# Patient Record
Sex: Female | Born: 1965 | Race: Black or African American | Hispanic: No | Smoking: Never smoker
Health system: Southern US, Community
[De-identification: ages and names within clinical notes are randomized; demographics above are authoritative.]

## PROBLEM LIST (undated history)

## (undated) DIAGNOSIS — I10 Essential (primary) hypertension: Secondary | ICD-10-CM

## (undated) DIAGNOSIS — I639 Cerebral infarction, unspecified: Secondary | ICD-10-CM

## (undated) DIAGNOSIS — G4730 Sleep apnea, unspecified: Secondary | ICD-10-CM

## (undated) DIAGNOSIS — R531 Weakness: Secondary | ICD-10-CM

## (undated) DIAGNOSIS — G4700 Insomnia, unspecified: Secondary | ICD-10-CM

## (undated) DIAGNOSIS — K219 Gastro-esophageal reflux disease without esophagitis: Secondary | ICD-10-CM

## (undated) DIAGNOSIS — E785 Hyperlipidemia, unspecified: Secondary | ICD-10-CM

## (undated) DIAGNOSIS — F419 Anxiety disorder, unspecified: Secondary | ICD-10-CM

## (undated) HISTORY — DX: Hyperlipidemia, unspecified: E78.5

## (undated) HISTORY — DX: Anxiety disorder, unspecified: F41.9

## (undated) HISTORY — DX: Cerebral infarction, unspecified: I63.9

## (undated) HISTORY — PX: ABDOMINAL HYSTERECTOMY: SHX81

---

## 2001-04-20 DIAGNOSIS — I639 Cerebral infarction, unspecified: Secondary | ICD-10-CM

## 2001-04-20 HISTORY — DX: Cerebral infarction, unspecified: I63.9

## 2001-04-27 ENCOUNTER — Inpatient Hospital Stay (HOSPITAL_COMMUNITY)
Admission: RE | Admit: 2001-04-27 | Discharge: 2001-05-06 | Payer: Self-pay | Admitting: Physical Medicine & Rehabilitation

## 2003-01-27 ENCOUNTER — Encounter
Admission: RE | Admit: 2003-01-27 | Discharge: 2003-04-27 | Payer: Self-pay | Admitting: Physical Medicine & Rehabilitation

## 2003-04-28 ENCOUNTER — Encounter
Admission: RE | Admit: 2003-04-28 | Discharge: 2003-07-27 | Payer: Self-pay | Admitting: Physical Medicine & Rehabilitation

## 2003-12-26 ENCOUNTER — Encounter
Admission: RE | Admit: 2003-12-26 | Discharge: 2004-03-25 | Payer: Self-pay | Admitting: Physical Medicine & Rehabilitation

## 2004-02-01 ENCOUNTER — Encounter
Admission: RE | Admit: 2004-02-01 | Discharge: 2004-02-01 | Payer: Self-pay | Admitting: Physical Medicine & Rehabilitation

## 2004-04-04 ENCOUNTER — Encounter
Admission: RE | Admit: 2004-04-04 | Discharge: 2004-07-03 | Payer: Self-pay | Admitting: Physical Medicine & Rehabilitation

## 2004-09-30 ENCOUNTER — Encounter
Admission: RE | Admit: 2004-09-30 | Discharge: 2004-12-29 | Payer: Self-pay | Admitting: Physical Medicine & Rehabilitation

## 2004-10-15 ENCOUNTER — Ambulatory Visit: Payer: Self-pay | Admitting: Physical Medicine & Rehabilitation

## 2005-01-08 ENCOUNTER — Encounter
Admission: RE | Admit: 2005-01-08 | Discharge: 2005-04-08 | Payer: Self-pay | Admitting: Physical Medicine & Rehabilitation

## 2005-01-10 ENCOUNTER — Ambulatory Visit: Payer: Self-pay | Admitting: Physical Medicine & Rehabilitation

## 2005-07-01 ENCOUNTER — Encounter
Admission: RE | Admit: 2005-07-01 | Discharge: 2005-09-29 | Payer: Self-pay | Admitting: Physical Medicine & Rehabilitation

## 2005-07-01 ENCOUNTER — Ambulatory Visit: Payer: Self-pay | Admitting: Physical Medicine & Rehabilitation

## 2005-08-11 ENCOUNTER — Ambulatory Visit: Payer: Self-pay | Admitting: Physical Medicine & Rehabilitation

## 2005-10-31 ENCOUNTER — Ambulatory Visit: Payer: Self-pay | Admitting: Physical Medicine & Rehabilitation

## 2005-10-31 ENCOUNTER — Encounter
Admission: RE | Admit: 2005-10-31 | Discharge: 2006-01-29 | Payer: Self-pay | Admitting: Physical Medicine & Rehabilitation

## 2005-12-23 ENCOUNTER — Ambulatory Visit: Payer: Self-pay | Admitting: Physical Medicine & Rehabilitation

## 2008-09-15 ENCOUNTER — Encounter: Payer: Self-pay | Admitting: Gastroenterology

## 2009-10-19 ENCOUNTER — Encounter: Admission: RE | Admit: 2009-10-19 | Discharge: 2009-10-19 | Payer: Self-pay | Admitting: Family Medicine

## 2009-10-30 ENCOUNTER — Encounter: Admission: RE | Admit: 2009-10-30 | Discharge: 2009-10-30 | Payer: Self-pay | Admitting: Family Medicine

## 2009-11-14 ENCOUNTER — Encounter: Admission: RE | Admit: 2009-11-14 | Discharge: 2009-11-14 | Payer: Self-pay | Admitting: Family Medicine

## 2010-02-19 ENCOUNTER — Encounter
Admission: RE | Admit: 2010-02-19 | Discharge: 2010-05-15 | Payer: Self-pay | Admitting: Physical Medicine & Rehabilitation

## 2010-02-25 ENCOUNTER — Ambulatory Visit: Payer: Self-pay | Admitting: Physical Medicine & Rehabilitation

## 2010-03-28 ENCOUNTER — Encounter
Admission: RE | Admit: 2010-03-28 | Discharge: 2010-04-04 | Payer: Self-pay | Admitting: Physical Medicine & Rehabilitation

## 2010-04-04 ENCOUNTER — Ambulatory Visit: Payer: Self-pay | Admitting: Physical Medicine & Rehabilitation

## 2010-05-15 ENCOUNTER — Ambulatory Visit: Payer: Self-pay | Admitting: Physical Medicine & Rehabilitation

## 2010-10-08 NOTE — Letter (Signed)
Summary: Work [facility]  [address] [place], [place] [address]   Phone: [phone]  Fax: [phone]    Today's Date: September 15, 2008  Name of Patient: Penny Murphy  The above named patient had a medical visit today at:  am / pm.  Please take this into consideration when reviewing the time away from work/school.    Special Instructions:  [  ] None  [  ] To be off the remainder of today, returning to the normal work / school schedule tomorrow.  [  ] To be off until the next scheduled appointment on  [  ] Other Please excuse patient for 09/14/2008.  Will be able to return to work 09/15/2008. __Due to procedure and sedation.  Patient will be able to return to full activity 09/15/2008.   Sincerely yours,   Milford Cage CMA

## 2011-01-24 NOTE — Assessment & Plan Note (Signed)
Penny Murphy is back regarding her left-sided stroke with right hemiparesis.  Penny Murphy continues to do fairly well with her phenol injections in the right  wrist. She still has some tone and is able to work on ranging the hand  manually. She wears a splint at night. She had a total abdominal  hysterectomy and oophorectomy on December 02, 2005 and seems to be recovering  fairly well from that. She is driving locally. Has no new complaints other  than feeling a little bit tired and a bit sore from her recent surgery. The  patient rates her sleep as fair to poor at times. She uses Ambien to help  her rest. No other new history items are noted today.   REVIEW OF SYSTEMS:  On review of systems patient denies any neurological,  psychiatric, constitutional, GI or cardiorespiratory complaints today. She  does have some night sweats. The full review is in the health and history  section.   SOCIAL HISTORY:  The patient is single and lives alone currently.   PHYSICAL EXAMINATION:  Blood pressure is 120/78, pulse is 90, respiratory  rate 16, she is saturating 99% on room air.   The patient is pleasant, in no acute distress. She is alert and oriented x3.  Affect is bright and appropriate. Gait is slightly wide-based but stable.  Coordination is fair. She still has some weakness in the right upper  extremity. She is 1+ to 2+ out of 5. Tone is 1+ at the wrist and fingers.  Sensory exam is fair in the right upper extremity today. Speech is good and  patient is articulate and speaks fluently. Cognitively she is appropriate.  Right shoulder is nontender bicipital maneuvers and impingement maneuvers  today.  Abdomen - wound is healing nicely. Patient remains overweight.   ASSESSMENT:  1.  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  2.  Insomnia.  3.  Bicipital tendinitis improved after injection.   PLAN:  1.  Continue baclofen and range of motion for tone.  2.  Consider  constraint  induced therapies although transportation is an      issue.  3.  I encouraged improved dietary habits and increased exercise.  4.  I will follow up with the patient in approximately 4 month's time.      Ranelle Oyster, M.D.  Electronically Signed     ZTS/MedQ  D:  12/24/2005 09:43:59  T:  12/24/2005 21:40:30  Job #:  [id number]

## 2011-01-24 NOTE — Discharge Summary (Signed)
Penny Murphy. [facility]  Patient:    Penny Murphy, Penny Murphy Visit Number: [id number] MRN: [id number]          Service Type: [facility] Location: [id number] Attending Physician:  Faith Rogue T Dictated by:   Dian Situ, PA Admit Date:  04/27/2001 Discharge Date: 05/06/2001   CC:         Dr. Sim Boast  Dr. [facility] Dr. Sudie Bailey   Discharge Summary  DISCHARGE DIAGNOSES: 1. Status post cerebrovascular accident. 2. History of drug abuse. 3. Bacterial vaginosis. 4. Syphilis positive titers. 5. Situational depression.  HISTORY OF PRESENT ILLNESS:  Penny Murphy is a 45 year old female who collapsed at home and was taken to [facility] ED by an acquaintance on August 13.  She was unresponsive at the time of admission.  PPD done was negative.  MRI showed posterior ischemic changes in left MCA/ACA region.  Carotid duplex was negative for ICA stenosis.  Cardiac echocardiogram showed LVH with EF of 65% with mild to moderate TR, no mass or thrombus.  Drug urine was positive for cocaine.  She was noted to have right hemiparesis and aphasia with right neglect. Neurology was consulted and coagulopathy studies were ordered on August 19.  Currently, reports are unavailable.  She was placed on aspirin for CVA prophylaxis.  She was noted to have vaginal discharge.  GYN exam was positive for bacterial vaginosis and the patient was started on Flagyl.  She is currently standby assist for transfers, minimal assist to standby assist for ambulating greater than 100 feet with assist.  She was noticed to have a depressed mood and was started on Zoloft for this.  PAST MEDICAL HISTORY: 1. Positive for two child births. 2. Alcohol abuse.  ALLERGIES:  No known drug allergies.  SOCIAL HISTORY:  The patient was living with a friend and was independent prior to admission.  Sister and children to assist past discharge.  She denies tobacco and  alcohol use.  She reports cocaine use two to three times a week.  HOSPITAL COURSE:  [name] was admitted to rehabilitation on April 27, 2001, for inpatient therapies to consist of PT and OT daily.  Past admission, the patient was maintained on aspirin for CVA prophylaxis.  She completed a seven-day course of Flagyl for her bacterial vaginosis.  Labs done on admission showed her urine to have 10,000 colonies of Klebsiella pneumoniae and she was treated with a previous course of Cipro for this.  Other labs showed hemoglobin 12.7, hematocrit 37.3, white count 7.8 and platelets 265. Sodium was 138, potassium 4.2, chloride 102, CO2 32, BUN 18, creatinine 0.8, glucose 91.  The patient was noted to be nonverbal at the time of admission, however, she had selective outbursts to certain people.  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  She shows mild to moderate deficits in reading comprehension.  There is mild to moderate deficits in written language with bisyllabic and monosyllabic levels.  Currently, she is 100% reliable for yes/no comprehension.  She is able to follow two-step commands, however, difficulty with three-step commands.  She is able to understand basic conversation.  Expression is improving, however, she requests minimal assistance with repetition of words.  She does show increased initiation to express basic needs, however, she is not consistent and does not initiate conversation.  She does respond to communication.  She was noted to have some problems with bladder incontinence and has been encouraged to self-toilet frequently.  Her  problems with incontinence have resolved by the time of discharge as the patient has been self-toileting more consistently. She is currently at distant supervision for ADL needs.  She requires minimal verbal cues to follow sequencing to set up and complete her task.  She does show good balance with functioning mobility and  control environment.  She is independent of transfers.  She is independent for ambulating 100 feet on level services and controlled environment supervision on uneven terrain.  She requires supervision for navigating stairs.  She requires supervision for safety secondary to a right inner tension in unsupervised or community settings or on uneven terrain.  Further followup therapies to include outpatient PT/OT and speech therapy to continue past discharge.  Of note, she was started on a Ritalin trial to help with her initiation output as well as mood.  This has greatly improved her participation and she is discharged on 5 mg b.i.d. to continue.  DISCHARGE MEDICATIONS: 1. Coated aspirin 225 mg per day. 2. Zoloft 50 mg per day. 3. Ritalin 5 mg b.i.d. at 8 a.m. and noon.  ACTIVITY:  She needs 24-hour supervision.  DIET:  Regular.  SPECIAL INSTRUCTIONS:  Absolutely no alcohol, no drugs, no driving.  Do have partner or partners tested and treated for syphilis.  FOLLOWUP:  The patient is to follow up with Dr. Riley Kill on October 2.  Follow up with a local physician at [facility]. Dictated by:   Dian Situ, PA Attending Physician:  Faith Rogue T DD:  05/06/01 TD:  05/06/01 Job: [phone] [id number]

## 2011-01-24 NOTE — Assessment & Plan Note (Signed)
Penny Murphy is back regarding her left CVA with spastic right hemiparesis.  She  has done well with the phenol injections to the right median nerve.  She has  some slight tightness still at the hand.  She uses a resting right wrist  hand orthosis at night.  She is interested in taking some classes in child  development at the [facility].  She is driving now after  receiving her license last month.  She has had no problems with her driving  abilities.  Her arm and biceps area has been bothering her as of late.  She  is not sure what brought this on.  She has had some boils examined in the  right axillary area but does not feel that these are directly relating to  the arm pain.  Sleep is poor at times.  She still has some spasms on the  right side.   REVIEW OF SYSTEMS:  Patient reports some tingling, spasms in the right side.  She has some intermittent coughing.  Otherwise pertinent positive listed  above and full review is in the health and history section of the chart.   SOCIAL HISTORY:  Patient is single.  Other pertinent issues are listed  above.   PHYSICAL EXAMINATION:  GENERAL APPEARANCE:  Patient is pleasant, in no acute  distress.  She is alert and oriented x3.  Affect is bright and appropriate.  VITAL SIGNS:  Blood pressure 132/68, pulse 62, respiratory rate 16, she is  sating 97% on room air.  LUNGS:  Clear.  CARDIOVASCULAR:  Regular rate and rhythm.  ABDOMEN:  Soft and nontender.  NEUROLOGIC:  Gate is slightly shuffling, favor the right side but stable  today.  Coordination is improved in the right upper extremity.  She has 2+/5  strength in the right upper extremity.  Tone remains trace to elbow and  wrists.  She still has 1+ to 2/4 tone at the deep finger flexor muscles.  Speech is stable.  Cognitively she is very appropriate.  Patient had  tenderness along the long head biceps tendon today with pain into the biceps  muscle.  Impingement testing  was  equivocal.   ASSESSMENT:  1.  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  2.  Insomnia.  3.  Right upper arm pain consistent with bicipital tendinitis.   PLAN:  1.  After informed consent, we injected the right long head biceps tendon      with 40 mg of Kenalog and 3 mL of 1% lidocaine.  Patient tolerated this      well.  I advised her to place ice over the area.  She will use her      hydrocodone that she received from the ER for a few days as needed for      pain.  I suspect that this will improve after this injection.  I will      call in refills for her pain medications if needed only.  2.  Continue Baclofen for tone.  3.  Encourage activity and safe driving.  4.  I will see the patient back in about two months' time.  Will consider      follow-up phenol injections at that point.      Ranelle Oyster, M.D.  Electronically Signed     ZTS/MedQ  D:  11/03/2005 10:04:49  T:  11/03/2005 15:28:49  Job #:  [id number]

## 2011-01-24 NOTE — Assessment & Plan Note (Signed)
MEDICAL RECORD NUMBER:  [id number].   Penny Murphy is back regarding her CVA and spastic right hemiparesis. She has done  well in the past with the Botox. She has had some increase in her right  handed spasticity since I last saw her in October. She is trying to stretch  her hand but is having difficulty secondary to the increased tone. She does  not have a hand/wrist splint. She also reports problems with sleeping. She  denies problems with her mood. She is not having a lot of pain overall. She  is living at home independently at this point.   REVIEW OF SYSTEMS:  The patient denies chest pain, palpitations. She has had  occasional shortness of breath. She has had no wheezing or coughing. Denies  seizures, weakness, numbness, confusion, blurred vision. She is getting four  hours of sleep a night. She has had no agitation, nausea, vomiting,  diarrhea, or constipation. She had no recent fevers. She has had some weight  gain. No skin changes or sweating noted.   PHYSICAL EXAMINATION:  On physical exam today, blood pressure 120/80, pulse  76. She is saturating 99% on room air. Respiratory rate is 14. The patient  walks with a slight limp and is alert and appropriate. She still had some  mild word finding deficits. Her speech is still not polished with articles  missing frequently in her grammar. Strength in the upper extremity on the  right is 3+ to 4/5 proximally and 1+/5 distally. Tone is 2+/4 at the deep  finger flexors. Wrist tone is stable as is biceps tone. Left upper extremity  strength is 4+ to 5/5. Lower extremities are intact with 5/5 strength  essentially.   ASSESSMENT:  1. Status post cerebral vascular accident, 434.01.  2. 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  3. Insomnia 780.52.   PLAN:  1. Will begin her on 50 mg of trazodone at bedtime. The patient may repeat     this x1.  2. I will set the patient up with Botox injections 200 units to the right     deep finger flexors.  3.  Will consider therapy trial and splinting after the injections are     completed.  4. I will see the patient back pending scheduling.      Ranelle Oyster, M.D.   ZTS/MedQ  D:  12/27/2003 11:18:41  T:  12/27/2003 12:40:59  Job #:  [id number]

## 2011-01-24 NOTE — Assessment & Plan Note (Signed)
Penny Murphy is back regarding her right hemiparesis.  We performed a fenyl  injection in February with decrease of her tone to a 0/4.  The patient has  had some interval increase in her tone again, but still feels the hand is  doing better.  She denies pain.  She remains active in the home with self-  care.  She would like to work again.  She does wear her splint regularly and  stretch the hand to exercise.  She plans on getting her drivers license.  She has not spoken with vocational rehab as of late.   SOCIAL HISTORY:  No new issues other than as mentioned above.   REVIEW OF SYSTEMS:  Stable.  Full review of systems is in the health and  history section of the chart.   PHYSICAL EXAMINATION:  VITAL SIGNS:  Blood pressure is 129/78, pulse 75,  respiratory rate 18, she is saturating 97% on room air.  GENERAL:  The patient is pleasant in no acute distress.  NEUROLOGIC:  Strength is 3+-4/5 proximally.  At the hand she is 2 to 2-/5.  Tone is 1-1+/2 at the wrist and fingers.  She had better active extension  today of the fingers than I have seen in the past.  Remains with decreased  sensation on the right.  Speech is good.  She has very good and fluent  speech with few word-finding deficits.  HEART:  Regular rate and rhythm.  LUNGS:  Clear.  ABDOMEN:  Soft and nontender.  The patient is moderately obese.   ASSESSMENT:  1.  Status post cerebrovascular accident.  2.  Spastic right hemiparesis.  3.  Insomnia.   PLAN:  1.  Encouraged home exercise program and stretching of the right upper      extremity.  2.  Urged the patient to contact vocational rehab as she has not heard from      them in a few months.  3.  Baclofen 20 mg q.i.d.  4.  Consider fenyl injection later on this year depending on evolution of      her tone.  5.  Continue with Zoloft and aspirin as ordered.  6.  I will see the patient back in six months' time.      ZTS/MedQ  D:  01/10/2005 09:57:13  T:  01/10/2005 10:31:12   Job #:  [id number]

## 2011-01-24 NOTE — Assessment & Plan Note (Signed)
DATE OF VISIT:  October 02, 2004   MEDICAL RECORD NUMBER:  [id number]   HISTORY:  Penny Murphy is back regarding her CVA with right spastic hemiparesis.  She has done fairly well with Botox injections but still has tightness in  the right wrist and fingers.  She still does not have functional use of the  hand, however.  She is able to range them freely and able to use splinting.  She does not use her splint consistently.  Patient denies frank hand or arm  pain.  She is independent at the household level.  She has not driven yet.  She spoke to vocational rehab one time since our last visit and they are  scheduled to be meeting with her again in the next month or two.  She still  wants to go back to work.   SOCIAL HISTORY:  Patient has been a bit stressed since her sister recently  was admitted to [facility] with a heart attack.   REVIEW OF SYSTEMS:  Patient denies any chest pain, shortness of breath,  cold, flu, wheezing, or coughing symptoms.  Sleep has been a little more  difficult of late with her sister's problems.  She denies any weakness,  numbness, spasms, problems with confusion, headaches, vomiting, diarrhea,  constipation, bowel or bladder incontinence.   PHYSICAL EXAMINATION:  Blood pressure is __________/84, pulse is 80,  respiratory rate is 18, she is saturating 97% on room air.  Patient walks  with a fairly normal gait but still favors the right side a bit.  Affect is  bright and appropriate.  Appearance is well kept.  Right upper extremity  strength remains 3+ to 4/5 proximally and 1+ to trace distally.  She has 2/4  tone at the right wrist and the fingers once again today.  She has trace  tone at the shoulder and biceps.  Right lower extremity strength is around  3+ to 4/5 throughout.  There is some decreased pinprick on the right side  but the patient has fair sensory function.  Speech is good.  She continues  to form thoughts better although there still are some mild  wordfinding  deficits.  Heart is regular rate and rhythm.  Lungs are clear.  Abdomen is  nontender, nontender.  Extremities showed no clubbing, cyanosis, or edema.   ASSESSMENT:  1.  Status post cerebrovascular accident (54098).  2.  Spastic right hemiparesis (34211).  3.  Insomnia (11914).   PLAN:  1.  We will set the patient up for phenol injections in the right median      nerve at the elbow.  We discussed the pros and cons of this injection      and the patient wishes to proceed.  2.  Await word from vocational rehab.  3.  Continue baclofen 20 mg q.i.d.  4.  I will see the patient back pending scheduling of said injection.      ZTS/MedQ  D:  10/02/2004 10:25:30  T:  10/02/2004 11:11:55  Job #:  [id number]

## 2011-01-24 NOTE — Procedures (Signed)
Penny Murphy, Penny Murphy                 ACCOUNT NO.:  [id number]   MEDICAL RECORD NO.:  [id number]          PATIENT TYPE:  REC   LOCATION:  [facility]                          FACILITY:  [facility]   PHYSICIAN:  Ranelle Oyster, M.D.DATE OF BIRTH:  [birth year]   DATE OF PROCEDURE:  08/12/2005  DATE OF DISCHARGE:                                 OPERATIVE REPORT   PROCEDURE:  Phenol injection, diagnostic code 342.11.   DESCRIPTION OF PROCEDURE:  After informed consent and preparation with  Betadine, we localized the right median nerve at the elbow.  We initially  probed with 80 mA amplitude and titrated down to 0.5 mA while at the same  time eliciting significant muscle contraction of the median-associated  nerves.  After aspiration we injected 5 cc of aqueous phenol around the  median nerve with a 50 mm 26-gauge monopolar needle.  The patient tolerated  well and had immediate relaxation of her median nerve muscles.  The patient  will continue bracing and range of motion.  She will be resuming driving  this month apparently as well.   I will see the patient back in about three months' time.  I refilled  Baclofen today for her, #90.      Ranelle Oyster, M.D.  Electronically Signed     ZTS/MEDQ  D:  08/12/2005 14:21:09  T:  08/13/2005 06:39:48  Job:  [id number]

## 2011-01-24 NOTE — Procedures (Signed)
NAMEBARRETT, HOLTHAUS                 ACCOUNT NO.:  [id number]   MEDICAL RECORD NO.:  [id number]          PATIENT TYPE:  REC   LOCATION:  [facility]                          FACILITY:  [facility]   PHYSICIAN:  Ranelle Oyster, M.D.DATE OF BIRTH:  1966/07/16   DATE OF PROCEDURE:  10/16/2004  DATE OF DISCHARGE:                                 OPERATIVE REPORT   DATE OF VISIT:  October 16, 2004.   MEDICAL RECORD NUMBER:  [id number].   DATE OF BIRTH:  1966-02-25.   DIAGNOSIS:  Spastic right hemiparesis status post cerebrovascular accident.  ICD9 codes 434.01 and 342.11.   DESCRIPTION OF PROCEDURE:  After informed consent and sterile preparation  with Betadine, we localized the median nerve at the elbow via surface and  then needle stimulation.  We used a 25 mm 26 gauge injectable monopolar  needle.  Initial high-amplitude stimulation was used and then eventually we  localized the median nerve via stimulation at 0.5 MA.  The area then was  aspirated and injected with 5 mL of aqueous phenol solution.  The patient  tolerated the procedure fairly well.  No bleeding or complications were  noted.  Post injection Ashworth scores at the wrist were 0/4.  At the  fingers, she was 1+/4.  The patient was advised to place ice over the elbow  crease.  Will follow up with her in approximately three months' time.      ZTS/MEDQ  D:  10/16/2004 09:45:53  T:  10/16/2004 10:34:29  Job:  [id number]

## 2011-01-24 NOTE — Assessment & Plan Note (Signed)
Penny Murphy is back regarding her spastic right hemiparesis.  She had great  result with Phenol injection.  She has had some emerging tone once again in  the finger flexors.  Hand still is improved from her baseline.  She has  plans of getting her license and going back to work.  Her mood has been  excellent.  She is attempting to work with vocational rehab regarding job  issues.   The patient denies any pain.  Spasticity interferes with general activity,  relationship with others, and enjoyment of life on a mild level.  Sleep is  fair to poor.   CURRENT MEDICATIONS:  1.  Zoloft 50 mg a day.  2.  Baclofen 20 mg q.i.d.   REVIEW OF SYSTEMS:  The patient denies any neurologic, psychiatric,  constitutional, GU, GI complaints today.  The patient has some difficulty  with sleep apnea.   SOCIAL HISTORY:  Pertinent positives listed above.  The patient lives alone.   PHYSICAL EXAMINATION:  VITAL SIGNS:  Blood pressure 123/75, pulse 82,  respiratory rate 18.  She is saturating 98% on room air.  GENERAL:  The patient is pleasant and in no acute distress.  The patient is  alert and oriented x3.  Affect is bright and pleasant.  HEART:  Regular rate and rhythm.  LUNGS:  Clear.  ABDOMEN:  Soft and nontender.  NEUROLOGY:  Coordination is fair on the left side.  Right upper extremity is  limited in movement.  Strength is still 3+ to 4 out of 5 proximally and 2  out of 5 distally.  Masked somewhat by her tone.  Tone is trace to 1+ at the  wrist and 2/2+ at the finger flexors.  Speech is improved.  She is very  fluent and articulate today.  Remainder of her neurologic examination is  stable.   ASSESSMENT:  1.  Status post cerebrovascular accident.  2.  Spastic right hemiparesis.  3.  Insomnia.   PLAN:  1.  We will set the patient up for Phenol injection of the right median      nerve at the elbow.  2.  Continue Baclofen 20 mg q.i.d. which I refilled today.  3.  Taper Zoloft to off over 3 weeks  time.  4.  I will see her back pending injections.      Ranelle Oyster, M.D.  Electronically Signed     ZTS/MedQ  D:  07/02/2005 09:44:41  T:  07/02/2005 11:28:41  Job #:  [id number]

## 2011-01-24 NOTE — Assessment & Plan Note (Signed)
MEDICAL RECORD NUMBER:  [id number].   Penny Murphy is back regarding her CVA with right spastic hemiparesis. She has  done pretty well with the Botox injections which we did into the right  forearm and finger flexors. She has still has some tone there but is looser  and more easily able to manipulate. She is not using her brace consistently  at home although she is waiting for a new one through independent living.  The patient really denies significant pain. She has 2/10 pain on average.  She had improved ambulation in upper extremity use. She is independent with  all of her mobility and self-care. She has lost some weight, stating that  she lost about six inches on her waist. The patient's mood has been good.  She is interested in going back to work.   REVIEW OF SYSTEMS:  The patient denies any chest pain, palpitations. She  does have occasional shortness of breath but denies wheezing, coughing,  seizures, weakness, numbness, spasms. Denies any problems with vision,  anxiety, sleep, or headaches. Denies nausea, vomiting, reflux, diarrhea,  constipation, or abdominal pain. The patient denies fevers, chills, weight  loss, changes in her skin.   PHYSICAL EXAMINATION:  On physical examination today the patient is pleasant  in no acute distress. Blood pressure is 115/78, pulse 90, respiratory rate  14. She is saturating 99% on room air. The patient walks with a slight limp  to the right side but is efficient. She is alert and appropriate. Appearance  is normal. Right upper extremity strength is 3+ to 4/5 proximally at the  hand and wrist. She is 1+ to 2 with flexion, extension is trace. Tone on the  right shoulder and biceps is trace; at the right wrist is 1+. Right lower  extremity tone is trace. Overall strength is 3+ to 4+/5 throughout. She has  some slight decrease in pinprick on the right side. This is minimal and  improved. Speech is totally intelligible. She has less word finding problems  and speaks in more complete sentences. Cranial nerve exam is nonfocal today.  Heart is regular rate and rhythm. Lungs are clear. Extremities showed no  clubbing, cyanosis, or edema.   ASSESSMENT:  1. 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  2. 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  3. Insomnia, 780.52.   PLAN:  1. I will make no new changes at this point. The patient needs to continue     wearing her brace for the wrist and hand on a nightly basis. She needs to     continue with daily stretching as well. I believe she can maintain this     wrist and hand at its current level for some time.  2. I wrote her a referral for vocational rehab as a I think she is     appropriate to look at returning to work.  3. I gave her permission to drive medically today.  4. I will see the patient back in about six months' time.      Ranelle Oyster, M.D.   ZTS/MedQ  D:  04/10/2004 10:19:26  T:  04/10/2004 11:41:37  Job #:  [id number]

## 2011-01-24 NOTE — Discharge Summary (Signed)
[facility]  Patient:    Penny Murphy, Penny Murphy Visit Number: [id number] MRN: [id number]          Service Type: [facility] Location: [id number] Attending Physician:  Faith Rogue T Dictated by:   Dian Situ, PA Admit Date:  04/27/2001 Discharge Date: 05/06/2001   CC:         Dr. Adella Hare  Dr. Kar[facility] attn Dr. Sudie Bailey   Discharge Summary  DISCHARGE DIAGNOSIS:  History of drug abuse.  HISTORY OF PRESENT ILLNESS:  Penny Murphy is a 45 year old female who collapsed at home, taken to [facility] ED on 8/13.  Was unresponsive at time of admission, and CCG done was negative.  MRI done showed posterior ischemic changes in the left MCA and left ACA region.  Carotid duplex was negative for ICA stenosis. Cardiac echocardiogram showed left ventricular hypertrophy with ejection fraction of 65%, mild mitral regurgitation, tricuspid regurgitation, no mass or thrombus.  Her urine was positive for cocaine.  She was noted to have right hemiparesis and aphasia with right neglect that continues.  Neurology, Dr. Adella Hare, was consulted, and coagulopathy studies ordered on 8/19, and pending.  The patient was placed on aspirin for cerebrovascular accident prophylaxis.  She did have a GYN exam, and she was positive for bacterial vaginitis, started on Flagyl for this.  She is currently standby assist for transfers, min assist to standby assist for ambulating greater than 100 feet with hand-held assist.  She was noted to have decreased mood and was started on Zoloft.  PAST MEDICAL HISTORY:  Negative, except for a history of drug abuse.  SOCIAL HISTORY:  The patient was living with a friend.  Does have family who will be assisting and providing supervision post discharge.  Denies alcohol or tobacco use.  Reports cocaine use 2 to 3 times a week.  HOSPITAL COURSE:  Penny Murphy was admitted to rehabilitation on  04/27/01, for inpatient therapies to consist of physical therapy and occupational therapy daily.  Past admission, she was maintained on Flagyl for 7 total days of treatment.  She was noted to have a flat effect that is somewhat improving by the time of discharge.  Labs done on admission revealed a hemoglobin of 12.7, hematocrit 37.3, white blood cell count 7.8, platelets 265.  Sodium 138, potassium 4.2, chloride 102, CO2 32, BUN 18, creatinine 0.8, glucose 91. Urine C&S showed 10,000 colonies of Klebsiella, and she was treated with a three day course of Cipro for this.  The patient has been nonverbal, and has required a lot of encouragement to speak.  She was started on Ritalin b.i.d. which has helped with her initiation as well as participation in therapy.  She is currently at distant supervision for bathing.  At times she requires min verbal cues during bathing dressing to follow sequencing to set up and complete tasks.  She shows good balance with function and mobility in controlled environment.  Initially, the patient was noted to have some incontinence episode with her bladder.  With toileting and with consistent self toileting this has resolved.  Currently, she is independent for bed mobility, independent for transfers, independent for ambulating 1000+ feet on level surfaces, supervision on uneven terrain.  She is able to climb 11 steps with supervision for railing.  She requires supervision secondary to her right inattention when she is outside a supervised setting. Dictated by:   Dian Situ, PA Attending Physician:  Faith Rogue T DD:  05/06/01 TD:  05/06/01 Job: [id number]

## 2011-11-19 ENCOUNTER — Other Ambulatory Visit: Payer: Self-pay | Admitting: Family Medicine

## 2011-11-19 DIAGNOSIS — M549 Dorsalgia, unspecified: Secondary | ICD-10-CM

## 2011-11-20 ENCOUNTER — Other Ambulatory Visit: Payer: Self-pay | Admitting: Family Medicine

## 2011-11-20 DIAGNOSIS — M545 Low back pain, unspecified: Secondary | ICD-10-CM

## 2011-11-21 ENCOUNTER — Other Ambulatory Visit: Payer: Self-pay

## 2011-11-21 ENCOUNTER — Ambulatory Visit
Admission: RE | Admit: 2011-11-21 | Discharge: 2011-11-21 | Disposition: A | Discharge status: Home / Self Care | Payer: Medicaid Other | Source: Ambulatory Visit | Attending: Family Medicine | Admitting: Family Medicine

## 2011-11-21 VITALS — BP 179/82 | HR 68 | Ht 67.0 in | Wt 233.0 lb

## 2011-11-21 DIAGNOSIS — M545 Low back pain, unspecified: Secondary | ICD-10-CM

## 2011-11-21 MED ORDER — IOHEXOL 180 MG/ML  SOLN
1.0000 mL | Freq: Once | INTRAMUSCULAR | Status: AC | PRN
  Administered 2011-11-21: 1 mL via EPIDURAL

## 2011-11-21 MED ORDER — METHYLPREDNISOLONE ACETATE 40 MG/ML INJ SUSP (RADIOLOG
120.0000 mg | Freq: Once | INTRAMUSCULAR | Status: AC
  Administered 2011-11-21: 120 mg via EPIDURAL

## 2011-11-21 NOTE — Discharge Instructions (Signed)

## 2012-06-09 ENCOUNTER — Encounter: Payer: Self-pay | Admitting: Physical Medicine & Rehabilitation

## 2012-06-21 ENCOUNTER — Encounter: Payer: Self-pay | Admitting: Physical Medicine & Rehabilitation

## 2012-06-21 ENCOUNTER — Encounter: Payer: Medicaid Other | Attending: Physical Medicine & Rehabilitation | Admitting: Physical Medicine & Rehabilitation

## 2012-06-21 ENCOUNTER — Telehealth: Payer: Self-pay | Admitting: Physical Medicine & Rehabilitation

## 2012-06-21 ENCOUNTER — Ambulatory Visit (HOSPITAL_COMMUNITY)
Admission: RE | Admit: 2012-06-21 | Discharge: 2012-06-21 | Disposition: A | Discharge status: Home / Self Care | Payer: Medicaid Other | Source: Ambulatory Visit | Attending: Physical Medicine & Rehabilitation | Admitting: Physical Medicine & Rehabilitation

## 2012-06-21 VITALS — BP 146/94 | HR 74 | Resp 14 | Ht 67.0 in | Wt 218.0 lb

## 2012-06-21 DIAGNOSIS — M549 Dorsalgia, unspecified: Secondary | ICD-10-CM

## 2012-06-21 DIAGNOSIS — I6330 Cerebral infarction due to thrombosis of unspecified cerebral artery: Secondary | ICD-10-CM | POA: Insufficient documentation

## 2012-06-21 DIAGNOSIS — M47816 Spondylosis without myelopathy or radiculopathy, lumbar region: Secondary | ICD-10-CM

## 2012-06-21 DIAGNOSIS — M5137 Other intervertebral disc degeneration, lumbosacral region: Secondary | ICD-10-CM | POA: Insufficient documentation

## 2012-06-21 DIAGNOSIS — Z5181 Encounter for therapeutic drug level monitoring: Secondary | ICD-10-CM

## 2012-06-21 DIAGNOSIS — IMO0002 Reserved for concepts with insufficient information to code with codable children: Secondary | ICD-10-CM

## 2012-06-21 DIAGNOSIS — M47817 Spondylosis without myelopathy or radiculopathy, lumbosacral region: Secondary | ICD-10-CM

## 2012-06-21 DIAGNOSIS — M51379 Other intervertebral disc degeneration, lumbosacral region without mention of lumbar back pain or lower extremity pain: Secondary | ICD-10-CM | POA: Insufficient documentation

## 2012-06-21 DIAGNOSIS — I69959 Hemiplegia and hemiparesis following unspecified cerebrovascular disease affecting unspecified side: Secondary | ICD-10-CM | POA: Insufficient documentation

## 2012-06-21 DIAGNOSIS — G8110 Spastic hemiplegia affecting unspecified side: Secondary | ICD-10-CM | POA: Insufficient documentation

## 2012-06-21 DIAGNOSIS — M519 Unspecified thoracic, thoracolumbar and lumbosacral intervertebral disc disorder: Secondary | ICD-10-CM | POA: Insufficient documentation

## 2012-06-21 DIAGNOSIS — M545 Low back pain, unspecified: Secondary | ICD-10-CM | POA: Insufficient documentation

## 2012-06-21 MED ORDER — GABAPENTIN 300 MG PO CAPS: 300.0000 mg | ORAL_CAPSULE | Freq: Three times a day (TID) | ORAL | Status: DC

## 2012-06-21 MED ORDER — TRAMADOL HCL 50 MG PO TABS: 50.0000 mg | ORAL_TABLET | Freq: Four times a day (QID) | ORAL | Status: DC | PRN

## 2012-06-21 NOTE — Progress Notes (Signed)
Subjective:    Patient ID: Penny Murphy, female    DOB: Aug 20, 1966, 46 y.o.   MRN: [id number]  HPI  This is a pleasant 46 yo AA female known to me from past visits. I have treated her for a left CVA with spastic HP and also for low back pain. We last saw her in the summer of 2011. She had suffered an accident and based on her MRI and exam, I felt she had L5-S1 disk disease with S1 radiculopathy which was causing her pain. She had been seen previously by orthopedics before coming to [place]. I had requested a left paracentral L5-S1 translaminar ESI which was performed by Dr. Wynn Banker in July of 2011. She felt this injection was very helpful, so much that she didn't come back to the office. Her back started to bother her again this year, and apparently another ESI was ordered which was done ag [facility], again at the L5-S1 level. She tells me that his injection was not as helpful. Over the last two months her pain has worsened. She had been given some percocet by a Dr. Cliffton Asters which helped control the pain to a degree, but she has been out of those for about 2 weeks.   Her pain is mostly in the left low back and radiates around the hip to the side and dorsum of the foot, and to a lesser extent the sole of the foot. It tends to be worse with bending, prolonged standint/sitting/walking. She is using up to 3 alleve per day which seem to help a bit. She does describe some weakness in the left leg but no frank numbness although the pain is "burning" which runs down the leg.  She is using amoxil for an abscessed tooth currently.  Pain Inventory Average Pain 9 Pain Right Now 9 My pain is sharp, burning, stabbing and aching  In the last 24 hours, has pain interfered with the following? General activity 10 Relation with others 9 Enjoyment of life 10 What TIME of day is your pain at its worst? varies Sleep (in general) Poor  Pain is worse with: walking, bending, sitting, standing and some activites Pain  improves with: medication and injections Relief from Meds: 9  Mobility walk without assistance how many minutes can you walk? 30 ability to climb steps?  yes do you drive?  yes transfers alone  Function disabled: date disabled 04/2001 I need assistance with the following:  dressing, bathing, meal prep, household duties and shopping  Neuro/Psych numbness tingling trouble walking spasms dizziness  Prior Studies x-rays CT/MRI nerve study  Physicians involved in your care Any changes since last visit?  no   Family History  Problem Relation Age of Onset  . Heart disease Mother   . Diabetes Mother   . Heart disease Father    History   Social History  . Marital Status: Single    Spouse Name: N/A    Number of Children: N/A  . Years of Education: N/A   Social History Main Topics  . Smoking status: Never Smoker   . Smokeless tobacco: Never Used  . Alcohol Use: No  . Drug Use: None  . Sexually Active: None   Other Topics Concern  . None   Social History Narrative  . None   Past Surgical History  Procedure Date  . Abdominal hysterectomy   . Cesarean section    Past Medical History  Diagnosis Date  . Hyperlipidemia   . Stroke   .  Anxiety    BP 146/94  Pulse 74  Resp 14  Ht 5\' 7"  (1.702 m)  Wt 218 lb (98.884 kg)  BMI 34.14 kg/m2  SpO2 98%     Review of Systems  Musculoskeletal: Positive for myalgias, back pain, arthralgias and gait problem.  Neurological: Positive for numbness.  All other systems reviewed and are negative.       Objective:   Physical Exam  General: Alert and oriented x 3, No apparent distress, obese HEENT: Head is normocephalic, atraumatic, PERRLA, EOMI, sclera anicteric, oral mucosa pink and moist, dentition intact, ext ear canals clear,  Neck: Supple without JVD or lymphadenopathy Heart: Reg rate and rhythm. No murmurs rubs or gallops Chest: CTA bilaterally without wheezes, rales, or rhonchi; no distress Abdomen: Soft,  non-tender, non-distended, bowel sounds positive. Extremities: No clubbing, cyanosis, or edema. Pulses are 2+ Skin: Clean and intact without signs of breakdown Neuro: she is alert and appropriate. She has a mild right 7 and speech is slightyl slurred but very intelligible. Occasional word finding deficits are still noted. She has spastic weakness of the RUE, grossly 1-2/5 with 2-3 tone. RLE is grossly 3+ to 4/5 with minimal resting tone. DTR'sare 3+ on the right and 2+ on the left, although she appeared a little decreased at the left ankle. Strength and sensation on the left were normal. She walks with some elevation of her right hemipelvis. She favors the right leg still with gait. Musculoskeletal: low back at the L4-S1 levels was tender. Some spasm was noted. She was flex her low back to about 40 degrees. Side bending caused tenderness and to a lesser extent extentsion. SLR was positive on the left. Facet maneuvers were equivocal, FABER's test negative. Psych: Pt's affect is appropriate. Pt is cooperative         Assessment & Plan:  1. 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 History of left CVA with spastic HP which has altered her gait mechanics   Plan: 1. Xrays of the l-spine were ordered to assess for advancement of her disease 2. Trial of neurontin for neuro pain, titrating up to 300mg  tid after one week 3. Tramadol for breakthrough pain, one q6 prn 4. Continue with alleve 2 tabs bid with food. 5. 30 minutes of face to face patient care time were spent during this visit. All questions were encouraged and answered. I will see her back next  Month

## 2012-06-21 NOTE — Patient Instructions (Addendum)
TAKE 2 NAPROXEN TWICE DAILY WITH FOOD

## 2012-06-21 NOTE — Telephone Encounter (Signed)
Let Penny Murphy know i have reviewed and we will stay with the current treatment plan until she follows up with me

## 2012-07-23 ENCOUNTER — Encounter: Payer: Medicaid Other | Attending: Physical Medicine & Rehabilitation | Admitting: Physical Medicine & Rehabilitation

## 2012-07-23 ENCOUNTER — Encounter: Payer: Self-pay | Admitting: Physical Medicine & Rehabilitation

## 2012-07-23 VITALS — BP 142/92 | HR 67 | Resp 14 | Ht 67.0 in | Wt 221.0 lb

## 2012-07-23 DIAGNOSIS — M47817 Spondylosis without myelopathy or radiculopathy, lumbosacral region: Secondary | ICD-10-CM | POA: Insufficient documentation

## 2012-07-23 DIAGNOSIS — G8110 Spastic hemiplegia affecting unspecified side: Secondary | ICD-10-CM | POA: Insufficient documentation

## 2012-07-23 DIAGNOSIS — IMO0002 Reserved for concepts with insufficient information to code with codable children: Secondary | ICD-10-CM | POA: Insufficient documentation

## 2012-07-23 DIAGNOSIS — M47816 Spondylosis without myelopathy or radiculopathy, lumbar region: Secondary | ICD-10-CM

## 2012-07-23 MED ORDER — METHOCARBAMOL 500 MG PO TABS: 500.0000 mg | ORAL_TABLET | Freq: Four times a day (QID) | ORAL | Status: DC | PRN

## 2012-07-23 MED ORDER — METHYLPREDNISOLONE 4 MG PO KIT: PACK | ORAL | Status: DC

## 2012-07-23 MED ORDER — HYDROCODONE-ACETAMINOPHEN 5-325 MG PO TABS: 1.0000 | ORAL_TABLET | Freq: Four times a day (QID) | ORAL | Status: DC | PRN

## 2012-07-23 NOTE — Patient Instructions (Signed)
CALL ME WITH QUESTIONS. REMAIN AS ACTIVE AS YOU ARE ABLE TO

## 2012-07-23 NOTE — Progress Notes (Signed)
Subjective:    Patient ID: Penny Murphy, female    DOB: Jul 08, 1966, 46 y.o.   MRN: [id number]  Back Pain Associated symptoms include numbness and weakness.  Hand Pain  Associated symptoms include numbness.   [name] is back regarding her low back pain. She hasn't made any progress since we last met. I ordered xrays of her lumbar spine which revealed:   Findings: Advanced disc space narrowing is present at L5-S1.  Moderate disc space narrowing is noted at L4-L5 with mild disc  space narrowing noted at L3-L4. There is lower lumbar facet  arthropathy. There is no compression fracture or malalignment. No  aggressive osseous lesions are seen. Moderate stool burden.  Unremarkable pelvis and lower ribs  The neurontin helped somewhat with her leg sx but not her back. The tramadol didn't help at all. She continues with alleve as well, but these aren't helping either. Her back pain is her biggest complaint by far. She cannot find a position where she feels comfortable. Sleep is adversely affected as well.  Pain Inventory Average Pain 9 Pain Right Now 9 My pain is constant  In the last 24 hours, has pain interfered with the following? General activity 9 Relation with others 10 Enjoyment of life 10 What TIME of day is your pain at its worst? all the time Sleep (in general) Poor  Pain is worse with: walking, bending, sitting, inactivity, standing and some activites Pain improves with: rest, medication and injections Relief from Meds: 8  Mobility walk without assistance use a cane how many minutes can you walk? 15 ability to climb steps?  yes do you drive?  yes  Function disabled 2002  disabled: date disabled  I need assistance with the following:  dressing, bathing, meal prep, household duties and shopping  Neuro/Psych weakness numbness tingling trouble walking anxiety  Prior Studies Any changes since last visit?  no  Physicians involved in your care Any changes since  last visit?  no   Family History  Problem Relation Age of Onset  . Heart disease Mother   . Diabetes Mother   . Heart disease Father    History   Social History  . Marital Status: Single    Spouse Name: N/A    Number of Children: N/A  . Years of Education: N/A   Social History Main Topics  . Smoking status: Never Smoker   . Smokeless tobacco: Never Used  . Alcohol Use: No  . Drug Use: None  . Sexually Active: None   Other Topics Concern  . None   Social History Narrative  . None   Past Surgical History  Procedure Date  . Abdominal hysterectomy   . Cesarean section    Past Medical History  Diagnosis Date  . Hyperlipidemia   . Stroke   . Anxiety    BP 142/92  Pulse 67  Resp 14  Ht 5\' 7"  (1.702 m)  Wt 221 lb (100.245 kg)  BMI 34.61 kg/m2  SpO2 98%     Review of Systems  Constitutional: Positive for diaphoresis and unexpected weight change.  Respiratory: Positive for apnea.   Musculoskeletal: Positive for back pain and gait problem.  Neurological: Positive for weakness and numbness.  Psychiatric/Behavioral: The patient is nervous/anxious.   All other systems reviewed and are negative.       Objective:   Physical Exam  General: Alert and oriented x 3, No apparent distress, obese  HEENT: Head is normocephalic, atraumatic, PERRLA, EOMI, sclera anicteric, oral  mucosa pink and moist, dentition intact, ext ear canals clear,  Neck: Supple without JVD or lymphadenopathy  Heart: Reg rate and rhythm. No murmurs rubs or gallops  Chest: CTA bilaterally without wheezes, rales, or rhonchi; no distress  Abdomen: Soft, non-tender, non-distended, bowel sounds positive.  Extremities: No clubbing, cyanosis, or edema. Pulses are 2+  Skin: Clean and intact without signs of breakdown  Neuro: she is alert and appropriate. She has a mild right 7 and speech is slightyl slurred but very intelligible. Occasional word finding deficits are still noted. She has spastic  weakness of the RUE, grossly 1-2/5 with 2-3 tone. RLE is grossly 3+ to 4/5 with minimal resting tone. DTR'sare 3+ on the right and 2+ on the left, although she appeared a little decreased at the left ankle still, inconsistent though. Strength and sensation on the left were normal. She walks with some elevation of her right hemipelvis. She favors the right leg still with gait.  Musculoskeletal: low back at the L4-S1 levels was tender, especially L5-S1. Some spasm was noted. She was flex her low back to about 40 degrees. Side bending caused tenderness and to a lesser extent extentsion. She tends to lean to the left to relieve the pain. SLR was positive on the left. Facet maneuvers were equivocal, FABER's test negative.   Psych: Pt's affect is appropriate. Pt is cooperative   Assessment & Plan:   1. Lumbar spondylosis with DDD most severe at L5-S1. L5 Radiculitis as well 2. 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 Will order an L5-S1 translaminar ESI paracentral to the left 2. Continue neurontin for neuro pain,   300mg  tid    3. Hydrocodone for breakthrough pain 5/325- one q6 prn  4. Continue with alleve 2 tabs bid with food. 5. Medrol dose pack was ordered as well as robaxin 500mg  q6 prn. 6. 30 minutes of face to face patient care time were spent during this visit. All questions were encouraged and answered. I will see her back pending injection. Consider MRI as well, although I see enough on exam and xray to proceed with the [facility] for now.

## 2012-08-10 ENCOUNTER — Encounter: Payer: Medicaid Other | Attending: Physical Medicine & Rehabilitation

## 2012-08-10 ENCOUNTER — Ambulatory Visit (HOSPITAL_BASED_OUTPATIENT_CLINIC_OR_DEPARTMENT_OTHER): Payer: Medicaid Other | Admitting: Physical Medicine & Rehabilitation

## 2012-08-10 ENCOUNTER — Encounter: Payer: Self-pay | Admitting: Physical Medicine & Rehabilitation

## 2012-08-10 VITALS — BP 149/83 | HR 110 | Resp 14 | Ht 67.0 in | Wt 222.0 lb

## 2012-08-10 DIAGNOSIS — IMO0002 Reserved for concepts with insufficient information to code with codable children: Secondary | ICD-10-CM | POA: Insufficient documentation

## 2012-08-10 DIAGNOSIS — M47817 Spondylosis without myelopathy or radiculopathy, lumbosacral region: Secondary | ICD-10-CM | POA: Insufficient documentation

## 2012-08-10 DIAGNOSIS — G8110 Spastic hemiplegia affecting unspecified side: Secondary | ICD-10-CM | POA: Insufficient documentation

## 2012-08-10 NOTE — Patient Instructions (Signed)
Epidural Steroid Injection Care After  Refer to this sheet in the next few weeks. These instructions provide you with information on caring for yourself after your procedure. Your caregiver may also give you more specific instructions. Your treatment has been planned according to current medical practices, but problems sometimes occur. Call your caregiver if you have any problems or questions after your procedure. HOME CARE INSTRUCTIONS   Avoid the use of heat on the injection site for a day.  Do not have a tub bath or soak in water for the rest of the day.  Remove the bandage on the next day.  Resume your normal activities on the next day.  Use ice packs or mild pain relievers to reduce the soreness around the injection site.  Follow up with your caregiver 7 to 10 days after the procedure. SEEK MEDICAL CARE IF:   You develop a fever of more than 100.5 F (38.1 C).  You continue to have pain and soreness over the injection site even after taking medicines.  You develop significant nausea or vomiting. SEEK IMMEDIATE MEDICAL CARE IF:   You have severe back pain, which is not relieved by medicines.  You develop severe headache, stiff neck, or sensitivity to light.  You develop any new numbness or weakness of your legs.  You lose control over your bladder or bowel movements.  You develop a fever of more than 102 F (38.9 C).  You develop difficulty breathing. Document Released: 12/10/2010 Document Revised: 11/17/2011 Document Reviewed: 12/10/2010 Exit[employer] Patient Information 2013 [employer].  

## 2012-08-10 NOTE — Progress Notes (Signed)
  PROCEDURE RECORD [facility]   Name: Penny Murphy DOB:[birth year] MRN: [id number]  Date:08/10/2012  Physician: Claudette Laws, MD    Nurse/CMA: Kelli Churn, CMA  Allergies: No Known Allergies  Consent Signed: yes  Is patient diabetic? no    Pregnant: no LMP: No LMP recorded. Patient has had a hysterectomy. (age 48-55)  Anticoagulants: no Anti-inflammatory: no Antibiotics: no  Procedure: transforaminal  Position: Prone Start Time:  2:51pm End Time:  2:56 Fluoro Time: 12  RN/CMA Turhan Chill, CMA Mariaha Ellington, CMA    Time 2:35pm 2:59pm    BP 149/83 153/104    Pulse 110 97    Respirations 14 14    O2 Sat 95 95    S/S 6 6    Pain Level 8/10 6/10     D/C home with Friend-Billy, patient A & O X 3, D/C instructions reviewed, and sits independently.

## 2012-08-10 NOTE — Progress Notes (Signed)
Lumbar epidural steroid injection Left L5-S1 translaminar under fluoroscopic guidance  Indication: Lumbosacral radiculitis is not relieved by medication management or other conservative care and interfering with self-care and mobility.  Informed consent was obtained after describing risk and benefits of the procedure with the patient, this includes bleeding, bruising, infection, paralysis and medication side effects.  The patient wishes to proceed and has given written consent.  Patient was placed in a prone position.  The lumbar area was marked and prepped with Betadine.  It was entered with a 25-gauge 1-1/2 inch needle and one mL of 1% lidocaine was injected into the skin and subcutaneous tissue.  Then a 17-gauge spinal needle was inserted under fluoroscopic guidance into the L5-S1 interlaminar space under AP and Lateral imaging.  Once needle tip of approximated the posterior elements, a loss of resistance technique was utilized with lateral imaging.  A positive loss of resistance was obtained and then confirmed by injecting 2 mL's of Omnipaque 180.  Then a solution containing 2 mL's of 40 mg per mL depomedrol and 2 mL's of 1% lidocaine was injected.  The patient tolerated procedure well.  Post procedure instructions were given.  Please se post procedure form.

## 2012-09-15 ENCOUNTER — Encounter: Payer: Self-pay | Admitting: Physical Medicine & Rehabilitation

## 2012-09-15 ENCOUNTER — Encounter: Payer: Medicaid Other | Attending: Physical Medicine & Rehabilitation | Admitting: Physical Medicine & Rehabilitation

## 2012-09-15 VITALS — BP 147/94 | HR 77 | Resp 14 | Ht 67.0 in | Wt 224.2 lb

## 2012-09-15 DIAGNOSIS — G8110 Spastic hemiplegia affecting unspecified side: Secondary | ICD-10-CM | POA: Insufficient documentation

## 2012-09-15 DIAGNOSIS — M47816 Spondylosis without myelopathy or radiculopathy, lumbar region: Secondary | ICD-10-CM

## 2012-09-15 DIAGNOSIS — M47817 Spondylosis without myelopathy or radiculopathy, lumbosacral region: Secondary | ICD-10-CM | POA: Insufficient documentation

## 2012-09-15 DIAGNOSIS — I6330 Cerebral infarction due to thrombosis of unspecified cerebral artery: Secondary | ICD-10-CM | POA: Insufficient documentation

## 2012-09-15 DIAGNOSIS — IMO0002 Reserved for concepts with insufficient information to code with codable children: Secondary | ICD-10-CM | POA: Insufficient documentation

## 2012-09-15 MED ORDER — HYDROCODONE-ACETAMINOPHEN 10-325 MG PO TABS: 1.0000 | ORAL_TABLET | Freq: Four times a day (QID) | ORAL | Status: DC | PRN

## 2012-09-15 NOTE — Progress Notes (Signed)
Subjective:    Patient ID: Penny Murphy, female    DOB: 1966/02/24, 47 y.o.   MRN: [id number]  HPI  Penny Murphy is back regarding her chronic back pain. She had her ESI on 08/10/12, (Left L5-S1 translaminar under fluoroscopic guidance), and it dropped her pain by 25-50% for three weeks. Since then her pain has increased again but only to 7/10. She has been out of her hydrocodone for about 2 weeks as well. She states the 5's don't work as well as the 10mg  doses do. She also found that percocet was more helpful also. The neurontin has really helped her leg pain, and it his point her pain is predominantly low back.  She has found a lumbar corset that she likes. It gives her support in supine and standing. She is not doing much in the way of exercise or stretching yet.    Pain Inventory Average Pain 7 Pain Right Now 7 My pain is constant, sharp and aching  In the last 24 hours, has pain interfered with the following? General activity 8 Relation with others 9 Enjoyment of life 10 What TIME of day is your pain at its worst? all the time Sleep (in general) Poor  Pain is worse with: walking, bending, sitting, standing and some activites Pain improves with: rest Relief from Meds: 8  Mobility walk without assistance how many minutes can you walk? 15 ability to climb steps?  no do you drive?  yes Do you have any goals in this area?  yes  Function not employed: date last employed  disabled: date disabled  I need assistance with the following:  feeding, dressing, bathing, toileting, meal prep, household duties and shopping Do you have any goals in this area?  yes  Neuro/Psych numbness trouble walking spasms anxiety  Prior Studies Any changes since last visit?  no  Physicians involved in your care Any changes since last visit?  no   Family History  Problem Relation Age of Onset  . Heart disease Mother   . Diabetes Mother   . Heart disease Father    History   Social  History  . Marital Status: Single    Spouse Name: N/A    Number of Children: N/A  . Years of Education: N/A   Social History Main Topics  . Smoking status: Never Smoker   . Smokeless tobacco: Never Used  . Alcohol Use: No  . Drug Use: None  . Sexually Active: None   Other Topics Concern  . None   Social History Narrative  . None   Past Surgical History  Procedure Date  . Abdominal hysterectomy   . Cesarean section    Past Medical History  Diagnosis Date  . Hyperlipidemia   . Stroke   . Anxiety    BP 147/94  Pulse 77  Resp 14  Ht 5\' 7"  (1.702 m)  Wt 224 lb 3.2 oz (101.696 kg)  BMI 35.11 kg/m2  SpO2 98%    Review of Systems  Constitutional: Positive for diaphoresis and unexpected weight change.  Respiratory: Positive for apnea and shortness of breath.   Musculoskeletal: Positive for back pain and gait problem.  Neurological: Positive for numbness.       Spasms   Psychiatric/Behavioral: The patient is nervous/anxious.   All other systems reviewed and are negative.       Objective:   Physical Exam General: Alert and oriented x 3, No apparent distress, obese  HEENT: Head is normocephalic, atraumatic, PERRLA, EOMI, sclera  anicteric, oral mucosa pink and moist, dentition intact, ext ear canals clear,  Neck: Supple without JVD or lymphadenopathy  Heart: Reg rate and rhythm. No murmurs rubs or gallops  Chest: CTA bilaterally without wheezes, rales, or rhonchi; no distress  Abdomen: Soft, non-tender, non-distended, bowel sounds positive.  Extremities: No clubbing, cyanosis, or edema. Pulses are 2+  Skin: Clean and intact without signs of breakdown  Neuro: she is alert and appropriate. She has a mild right 7 and speech is slightyl slurred but very intelligible. Occasional word finding deficits are still noted. She has spastic weakness of the RUE, grossly 1-2/5 with 2-3 tone. RLE is grossly 3+ to 4/5 with minimal resting tone. DTR'sare 3+ on the right and 2+ on the  left, although she appeared a little decreased at the left ankle still, inconsistent though. Strength and sensation on the left were normal. She walks with some elevation of her right hemipelvis. She favors the right leg still with gait.  Musculoskeletal: low back at the L4-S1 levels was tender, especially L5-S1. mild spasm was noted. She was flex her low back to about 40 degrees due to pain.  She tends to lean to the left to relieve the pain. She appeared more comfortable in standing and sitting today. SLR was positive on the left. Facet maneuvers were equivocal, FABER's test negative.  Psych: Pt's affect is appropriate. Pt is cooperative. Pt is somewhat anxious. Assessment & Plan:   1. Lumbar spondylosis with DDD most severe at L5-S1. L5 Radiculitis which has improved for the most part. 2. 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 Will order a second L5-S1 translaminar ESI paracentral to the left  2. Continue neurontin for neuro pain, 300mg  tid  3. Increase hydrocodone to 10/325 4. Continue with alleve 2 tabs bid with food.  5. Discussed appropriate posture, technique, ROM, etc.  6. 30 minutes of face to face patient care time were spent during this visit. All questions were encouraged and answered. I will see her back pending injection. Again consider an MRI in the future if symptoms don't improve.

## 2012-09-15 NOTE — Patient Instructions (Signed)
AVOID EXCESSIVE BENDING, LIFTING, TWISTING, ESPECIALLY AGAINST RESISTANCE

## 2012-09-30 ENCOUNTER — Encounter: Payer: Self-pay | Admitting: Physical Medicine & Rehabilitation

## 2012-09-30 ENCOUNTER — Ambulatory Visit (HOSPITAL_BASED_OUTPATIENT_CLINIC_OR_DEPARTMENT_OTHER): Payer: Medicaid Other | Admitting: Physical Medicine & Rehabilitation

## 2012-09-30 VITALS — BP 147/83 | HR 86 | Resp 14 | Ht 67.0 in | Wt 227.0 lb

## 2012-09-30 DIAGNOSIS — IMO0002 Reserved for concepts with insufficient information to code with codable children: Secondary | ICD-10-CM

## 2012-09-30 NOTE — Patient Instructions (Signed)
Since she ran out of your pain medicine early, you'll need to call Dr. Riley Kill and see whether he is willing to refill you early. If you have some type of fall in the future you must notify this office He'll see Dr. Hermelinda Medicus next month

## 2012-09-30 NOTE — Progress Notes (Signed)
  PROCEDURE RECORD [facility]   Name: Penny Murphy DOB:29-Sep-1965 MRN: [id number]  Date:09/30/2012  Physician: Claudette Laws, MD    Nurse/CMA: Kelli Churn, CMA/ Shumaker, RN  Allergies: No Known Allergies  Consent Signed: yes  Is patient diabetic? no    Pregnant: no LMP: No LMP recorded. Patient has had a hysterectomy. (age [age])  Anticoagulants: no Anti-inflammatory: no Antibiotics: no  Procedure: translaminor epidural steroid injection  Position: Prone Start Time: 11:06  End Time: 11:10  Fluoro Time: 9 seconds  RN/CMA Saidah Kempton, CMA Shumaker, RN    Time 1012 11:13    BP 147/83 147/92    Pulse 82 80    Respirations 14 14    O2 Sat 98 98    S/S 6 6    Pain Level 9/10 9/10     D/C home with Billy-friend, patient A & O X 3, D/C instructions reviewed, and sits independently.

## 2012-10-18 ENCOUNTER — Other Ambulatory Visit: Payer: Self-pay | Admitting: Physical Medicine & Rehabilitation

## 2012-10-19 ENCOUNTER — Other Ambulatory Visit: Payer: Self-pay | Admitting: *Deleted

## 2012-10-19 DIAGNOSIS — I6330 Cerebral infarction due to thrombosis of unspecified cerebral artery: Secondary | ICD-10-CM

## 2012-10-19 DIAGNOSIS — IMO0002 Reserved for concepts with insufficient information to code with codable children: Secondary | ICD-10-CM

## 2012-10-19 DIAGNOSIS — G8110 Spastic hemiplegia affecting unspecified side: Secondary | ICD-10-CM

## 2012-10-19 DIAGNOSIS — M47816 Spondylosis without myelopathy or radiculopathy, lumbar region: Secondary | ICD-10-CM

## 2012-10-19 MED ORDER — HYDROCODONE-ACETAMINOPHEN 10-325 MG PO TABS: 1.0000 | ORAL_TABLET | Freq: Four times a day (QID) | ORAL | Status: DC | PRN

## 2012-10-19 NOTE — Telephone Encounter (Signed)
Refilled hydrocodone after my chart request for refill.

## 2012-10-23 ENCOUNTER — Other Ambulatory Visit: Payer: Self-pay

## 2012-10-29 ENCOUNTER — Encounter: Payer: Medicaid Other | Attending: Physical Medicine & Rehabilitation | Admitting: Physical Medicine & Rehabilitation

## 2012-10-29 ENCOUNTER — Encounter: Payer: Self-pay | Admitting: Physical Medicine & Rehabilitation

## 2012-10-29 VITALS — BP 142/87 | HR 101 | Resp 14 | Ht 67.0 in | Wt 223.0 lb

## 2012-10-29 DIAGNOSIS — IMO0002 Reserved for concepts with insufficient information to code with codable children: Secondary | ICD-10-CM | POA: Insufficient documentation

## 2012-10-29 DIAGNOSIS — G8110 Spastic hemiplegia affecting unspecified side: Secondary | ICD-10-CM | POA: Insufficient documentation

## 2012-10-29 DIAGNOSIS — M47816 Spondylosis without myelopathy or radiculopathy, lumbar region: Secondary | ICD-10-CM

## 2012-10-29 DIAGNOSIS — I6330 Cerebral infarction due to thrombosis of unspecified cerebral artery: Secondary | ICD-10-CM | POA: Insufficient documentation

## 2012-10-29 DIAGNOSIS — M47817 Spondylosis without myelopathy or radiculopathy, lumbosacral region: Secondary | ICD-10-CM | POA: Insufficient documentation

## 2012-10-29 MED ORDER — HYDROCODONE-ACETAMINOPHEN 10-325 MG PO TABS: 1.0000 | ORAL_TABLET | Freq: Four times a day (QID) | ORAL | Status: AC | PRN

## 2012-10-29 MED ORDER — HYDROCODONE-ACETAMINOPHEN 10-325 MG PO TABS: 1.0000 | ORAL_TABLET | Freq: Four times a day (QID) | ORAL | Status: DC | PRN

## 2012-10-29 MED ORDER — GABAPENTIN 600 MG PO TABS: 600.0000 mg | ORAL_TABLET | Freq: Three times a day (TID) | ORAL | Status: DC

## 2012-10-29 NOTE — Patient Instructions (Signed)
YOU MUST USE GOOD POSTURE.   YOU HAVE TO BE REALISTIC ABOUT YOUR ACTIVITY AS FAR AS INTENSITY, WEIGHT LIFTING IS CONCERNED.  YOU MUST WORK ON BETTER TECHNIQUE WITH YOUR WALKING.

## 2012-10-29 NOTE — Progress Notes (Signed)
Subjective:    Patient ID: Teresa Coombs, female    DOB: 03/22/1966, 47 y.o.   MRN: [id number]  HPI  Penny Murphy is back regarding her low back and leg pain. She had good results with the last ESI. However, she told met that she is in the process of moving, and her back is acting up again, although not nearly to the point it was a couple months ago. She is having predominantly left low back pain with associated leg pain as well.   Her hydrcodone tends to help breakthrough sx. She is also on robaxin and gabapentin as well which seem to help. She is tolerating all medicines well.    Pain Inventory Average Pain 7 Pain Right Now 7 My pain is intermittent  In the last 24 hours, has pain interfered with the following? General activity 8 Relation with others 8 Enjoyment of life 8 What TIME of day is your pain at its worst? morning, evening and night Sleep (in general) Poor  Pain is worse with: some activites Pain improves with: medication and injections Relief from Meds: 6  Mobility walk without assistance ability to climb steps?  yes do you drive?  yes transfers alone  Function disabled: date disabled unknown I need assistance with the following:  dressing, bathing, meal prep, household duties and shopping  Neuro/Psych weakness numbness tingling spasms dizziness anxiety  Prior Studies Any changes since last visit?  no  Physicians involved in your care Any changes since last visit?  no   Family History  Problem Relation Age of Onset  . Heart disease Mother   . Diabetes Mother   . Heart disease Father    History   Social History  . Marital Status: Single    Spouse Name: N/A    Number of Children: N/A  . Years of Education: N/A   Social History Main Topics  . Smoking status: Never Smoker   . Smokeless tobacco: Never Used  . Alcohol Use: No  . Drug Use: None  . Sexually Active: None   Other Topics Concern  . None   Social History Narrative  . None    Past Surgical History  Procedure Laterality Date  . Abdominal hysterectomy    . Cesarean section     Past Medical History  Diagnosis Date  . Hyperlipidemia   . Stroke   . Anxiety    BP 142/87  Pulse 101  Resp 14  Ht 5\' 7"  (1.702 m)  Wt 223 lb (101.152 kg)  BMI 34.92 kg/m2  SpO2 97%     Review of Systems  Constitutional: Positive for diaphoresis and unexpected weight change.  Respiratory: Positive for apnea.   Neurological: Positive for dizziness, weakness and numbness.  Psychiatric/Behavioral: The patient is nervous/anxious.   All other systems reviewed and are negative.       Objective:   Physical Exam General: Alert and oriented x 3, No apparent distress, obese  HEENT: Head is normocephalic, atraumatic, PERRLA, EOMI, sclera anicteric, oral mucosa pink and moist, dentition intact, ext ear canals clear,  Neck: Supple without JVD or lymphadenopathy  Heart: Reg rate and rhythm. No murmurs rubs or gallops  Chest: CTA bilaterally without wheezes, rales, or rhonchi; no distress  Abdomen: Soft, non-tender, non-distended, bowel sounds positive.  Extremities: No clubbing, cyanosis, or edema. Pulses are 2+  Skin: Clean and intact without signs of breakdown  Neuro: she is alert and appropriate. She has a mild right 7 and speech is slightyl slurred but  very intelligible. Occasional word finding deficits are still noted. She has spastic weakness of the RUE, grossly 1-2/5 with 2-3 tone. RLE is grossly 3+ to 4/5 with minimal resting tone. DTR'sare 3+ on the right and 2+ on the left, although she appeared a little decreased at the left ankle still, inconsistent though. Strength and sensation on the left were normal. She walks with some elevation of her right hemipelvis. She favors the right leg still with gait.  Musculoskeletal: low back at the L4-S1 levels was tender, especially L5-S1. mild spasm was noted. She was flex her low back to about 60 degrees due to pain. She still tends to  lean to the left to relieve the pain. She appeared more comfortable in standing and sitting today. SLR was minimally positive on the left. Facet maneuvers were equivocal, FABER's test negative. She tends to walk on angle toward the right while leaning to the left. She does not swing her arms with ambulation, particularly the right.  Psych: Pt's affect is appropriate. Pt is cooperative. Pt is somewhat anxious.   Assessment & Plan:   1. Lumbar spondylosis with DDD most severe at L5-S1. L5 Radiculitis which has improved for the most part.  2. 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 Will hold off on further injections at this point. Could consider follow up ESI down the road a bit, but I described to her that these are methods of decreasing her pain, not curative treatment.  2. Increased neurontin to 600mg  tid for radicular left leg pain.  3. Continue hydrocodone 10/325  q6 prn 4. Continue with alleve 2 tabs bid with food for now.  5. Discussed appropriate posture, technique, ROM, etc. Exercises were provided. Her walking technique is crucial, but she insists that she cannot change how she walks.  She also must use more common sense. 6. 30 minutes of face to face patient care time were spent during this visit. All questions were encouraged and answered.

## 2012-11-24 ENCOUNTER — Encounter
Payer: Medicaid Other | Attending: Physical Medicine and Rehabilitation | Admitting: Physical Medicine and Rehabilitation

## 2012-11-24 ENCOUNTER — Encounter: Payer: Self-pay | Admitting: Physical Medicine and Rehabilitation

## 2012-11-24 VITALS — BP 138/87 | HR 109 | Resp 16 | Ht 67.0 in | Wt 229.0 lb

## 2012-11-24 DIAGNOSIS — M51379 Other intervertebral disc degeneration, lumbosacral region without mention of lumbar back pain or lower extremity pain: Secondary | ICD-10-CM | POA: Insufficient documentation

## 2012-11-24 DIAGNOSIS — IMO0002 Reserved for concepts with insufficient information to code with codable children: Secondary | ICD-10-CM

## 2012-11-24 DIAGNOSIS — Z79899 Other long term (current) drug therapy: Secondary | ICD-10-CM

## 2012-11-24 DIAGNOSIS — M5137 Other intervertebral disc degeneration, lumbosacral region: Secondary | ICD-10-CM | POA: Insufficient documentation

## 2012-11-24 DIAGNOSIS — R269 Unspecified abnormalities of gait and mobility: Secondary | ICD-10-CM | POA: Insufficient documentation

## 2012-11-24 DIAGNOSIS — I69959 Hemiplegia and hemiparesis following unspecified cerebrovascular disease affecting unspecified side: Secondary | ICD-10-CM | POA: Insufficient documentation

## 2012-11-24 DIAGNOSIS — Z5181 Encounter for therapeutic drug level monitoring: Secondary | ICD-10-CM

## 2012-11-24 DIAGNOSIS — M47817 Spondylosis without myelopathy or radiculopathy, lumbosacral region: Secondary | ICD-10-CM

## 2012-11-24 DIAGNOSIS — G8110 Spastic hemiplegia affecting unspecified side: Secondary | ICD-10-CM

## 2012-11-24 DIAGNOSIS — I6330 Cerebral infarction due to thrombosis of unspecified cerebral artery: Secondary | ICD-10-CM

## 2012-11-24 NOTE — Progress Notes (Signed)
Subjective:    Patient ID: Penny Murphy, female    DOB: 1966/05/07, 47 y.o.   MRN: [id number]  HPI This is a pleasant 47 y.o. AA female . Hx of left CVA with spastic HP and also for low back pain. We last saw her in the summer of 2011. She had suffered an accident and based on her MRI and exam,  she had L5-S1 disk disease with S1 radiculopathy which was causing her pain. She had been seen previously by orthopedics before coming to [place]. She had  a left paracentral L5-S1 translaminar ESI which was performed by Dr. Wynn Banker in July of 2011. She felt this injection was very helpful, so much that she didn't come back to the office. Her back started to bother her again this year, and apparently another ESI was ordered which was done ag [facility], again at the L5-S1 level. She tells me that his injection was not as helpful. Over the last two months her pain has worsened.She had another ESI on 09/30/2012 by Dr. Doroteo Bradford, which did not give her much relief. Her pain is mostly in the left low back and radiates around the hip to the side and dorsum of the foot, and to a lesser extent the sole of the foot. It tends to be worse with bending, prolonged standint/sitting/walking.   Pain Inventory Average Pain 8 Pain Right Now 8 My pain is sharp, stabbing, tingling and aching  In the last 24 hours, has pain interfered with the following? General activity 7 Relation with others 7 Enjoyment of life 7 What TIME of day is your pain at its worst? evening and night Sleep (in general) Poor  Pain is worse with: walking, bending, sitting, standing and some activites Pain improves with: rest, medication and injections Relief from Meds: 5  Mobility walk without assistance how many minutes can you walk? 20 ability to climb steps?  yes do you drive?  yes Do you have any goals in this area?  yes  Function disabled: date disabled see chart I need assistance with the following:   dressing  Neuro/Psych weakness numbness tingling spasms anxiety  Prior Studies Any changes since last visit?  no  Physicians involved in your care Any changes since last visit?  no   Family History  Problem Relation Age of Onset  . Heart disease Mother   . Diabetes Mother   . Heart disease Father    History   Social History  . Marital Status: Single    Spouse Name: N/A    Number of Children: N/A  . Years of Education: N/A   Social History Main Topics  . Smoking status: Never Smoker   . Smokeless tobacco: Never Used  . Alcohol Use: No  . Drug Use: None  . Sexually Active: None   Other Topics Concern  . None   Social History Narrative  . None   Past Surgical History  Procedure Laterality Date  . Abdominal hysterectomy    . Cesarean section     Past Medical History  Diagnosis Date  . Hyperlipidemia   . Stroke   . Anxiety    BP 138/87  Pulse 109  Resp 16  Ht 5\' 7"  (1.702 m)  Wt 229 lb (103.874 kg)  BMI 35.86 kg/m2  SpO2 96%     Review of Systems  Respiratory: Positive for apnea.   Musculoskeletal: Positive for back pain.  Neurological: Positive for weakness and numbness.  Psychiatric/Behavioral: The patient is nervous/anxious.  All other systems reviewed and are negative.       Objective:   Physical Exam General: Alert and oriented x 3, No apparent distress, obese  HEENT: Head is normocephalic, atraumatic,   Neck: Supple  Extremities: No clubbing, cyanosis, or edema.  Skin: Clean and intact without signs of breakdown  Neuro: she is alert and appropriate.  She has spastic weakness of the RUE, grossly 1-2/5 with 2-3 tone. RLE is grossly 3+ to 4/5 with minimal resting tone. DTR's are 3+ on the right and 2+ on the left, although she appeared a little decreased at the left ankle. Strength and sensation on the left were normal. She walks with some elevation of her right hemipelvis. She favors the right leg still with gait.  Musculoskeletal:  low back at the L4-S1 levels was tender. Some spasm was noted. ROM restricted.  Psych: Pt's affect is appropriate. Pt is cooperative        Assessment & Plan:  1. Lumbar spondylosis with DDD and likely left L5/S1 radiculopathy  2. 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 She states, that she went to the ED at [facility], and got a prescription for the oxycodone, which she filled and took. She signed a contract with [place], and violated it several times. We do not think that it would be safe to prescribe narcotics to her, because she does not take it as prescribed, and also took additional narcotics from a different provider.  I educated the patient , that we do not feel that it would be safe for her to prescribe narcotics, she violated the contract several times, therefore we will d/c the patient. The patient understood.

## 2012-11-26 ENCOUNTER — Other Ambulatory Visit: Payer: Self-pay | Admitting: Family Medicine

## 2012-11-26 DIAGNOSIS — M549 Dorsalgia, unspecified: Secondary | ICD-10-CM

## 2012-11-30 ENCOUNTER — Ambulatory Visit
Admission: RE | Admit: 2012-11-30 | Discharge: 2012-11-30 | Disposition: A | Discharge status: Home / Self Care | Payer: Medicaid Other | Source: Ambulatory Visit | Attending: Family Medicine | Admitting: Family Medicine

## 2012-11-30 DIAGNOSIS — M549 Dorsalgia, unspecified: Secondary | ICD-10-CM

## 2012-11-30 MED ORDER — IOHEXOL 180 MG/ML  SOLN
1.0000 mL | Freq: Once | INTRAMUSCULAR | Status: AC | PRN
  Administered 2012-11-30: 1 mL via EPIDURAL

## 2012-11-30 MED ORDER — METHYLPREDNISOLONE ACETATE 40 MG/ML INJ SUSP (RADIOLOG
120.0000 mg | Freq: Once | INTRAMUSCULAR | Status: AC
  Administered 2012-11-30: 120 mg via EPIDURAL

## 2012-11-30 NOTE — Discharge Instructions (Signed)
Post Procedure Spinal Discharge Instruction Sheet ° °1. You may resume a regular diet and any medications that you routinely take (including pain medications). ° °2. No driving day of procedure. ° °3. Light activity throughout the rest of the day.  Do not do any strenuous work, exercise, bending or lifting.  The day following the procedure, you can resume normal physical activity but you should refrain from exercising or physical therapy for at least three days thereafter. ° ° °Common Side Effects: ° °· Headaches- take your usual medications as directed by your physician.  Increase your fluid intake.  Caffeinated beverages may be helpful.  Lie flat in bed until your headache resolves. ° °· Restlessness or inability to sleep- you may have trouble sleeping for the next few days.  Ask your referring physician if you need any medication for sleep. ° °· Facial flushing or redness- should subside within a few days. ° °· Increased pain- a temporary increase in pain a day or two following your procedure is not unusual.  Take your pain medication as prescribed by your referring physician. ° °· Leg cramps ° °Please contact our office at [phone] for the following symptoms: °· Fever greater than 100 degrees. °· Headaches unresolved with medication after 2-3 days. °· Increased swelling, pain, or redness at injection site. ° °Thank you for visiting our office. °

## 2013-04-20 ENCOUNTER — Other Ambulatory Visit: Payer: Self-pay | Admitting: Family Medicine

## 2013-04-20 DIAGNOSIS — M545 Low back pain, unspecified: Secondary | ICD-10-CM

## 2013-04-26 ENCOUNTER — Ambulatory Visit
Admission: RE | Admit: 2013-04-26 | Discharge: 2013-04-26 | Disposition: A | Discharge status: Home / Self Care | Payer: Medicaid Other | Source: Ambulatory Visit | Attending: Family Medicine | Admitting: Family Medicine

## 2013-04-26 DIAGNOSIS — M545 Low back pain, unspecified: Secondary | ICD-10-CM

## 2013-04-26 MED ORDER — METHYLPREDNISOLONE ACETATE 40 MG/ML INJ SUSP (RADIOLOG
120.0000 mg | Freq: Once | INTRAMUSCULAR | Status: AC
  Administered 2013-04-26: 120 mg via EPIDURAL

## 2013-04-26 MED ORDER — IOHEXOL 180 MG/ML  SOLN
1.0000 mL | Freq: Once | INTRAMUSCULAR | Status: AC | PRN
  Administered 2013-04-26: 1 mL via EPIDURAL

## 2013-04-26 NOTE — Discharge Instructions (Signed)
Post Procedure Spinal Discharge Instruction Sheet ° °1. You may resume a regular diet and any medications that you routinely take (including pain medications). ° °2. No driving day of procedure. ° °3. Light activity throughout the rest of the day.  Do not do any strenuous work, exercise, bending or lifting.  The day following the procedure, you can resume normal physical activity but you should refrain from exercising or physical therapy for at least three days thereafter. ° ° °Common Side Effects: ° °· Headaches- take your usual medications as directed by your physician.  Increase your fluid intake.  Caffeinated beverages may be helpful.  Lie flat in bed until your headache resolves. ° °· Restlessness or inability to sleep- you may have trouble sleeping for the next few days.  Ask your referring physician if you need any medication for sleep. ° °· Facial flushing or redness- should subside within a few days. ° °· Increased pain- a temporary increase in pain a day or two following your procedure is not unusual.  Take your pain medication as prescribed by your referring physician. ° °· Leg cramps ° °Please contact our office at [phone] for the following symptoms: °· Fever greater than 100 degrees. °· Headaches unresolved with medication after 2-3 days. °· Increased swelling, pain, or redness at injection site. ° °Thank you for visiting our office. °

## 2013-07-14 ENCOUNTER — Other Ambulatory Visit: Payer: Self-pay

## 2013-11-17 ENCOUNTER — Other Ambulatory Visit: Payer: Self-pay | Admitting: Orthopedic Surgery

## 2013-11-17 DIAGNOSIS — M545 Low back pain, unspecified: Secondary | ICD-10-CM

## 2013-11-17 DIAGNOSIS — M79606 Pain in leg, unspecified: Secondary | ICD-10-CM

## 2013-11-22 ENCOUNTER — Ambulatory Visit
Admission: RE | Admit: 2013-11-22 | Discharge: 2013-11-22 | Disposition: A | Discharge status: Home / Self Care | Payer: Medicaid Other | Source: Ambulatory Visit | Attending: Orthopedic Surgery | Admitting: Orthopedic Surgery

## 2013-11-22 VITALS — BP 165/75 | HR 64

## 2013-11-22 DIAGNOSIS — M545 Low back pain, unspecified: Secondary | ICD-10-CM

## 2013-11-22 DIAGNOSIS — M47816 Spondylosis without myelopathy or radiculopathy, lumbar region: Secondary | ICD-10-CM

## 2013-11-22 DIAGNOSIS — I6330 Cerebral infarction due to thrombosis of unspecified cerebral artery: Secondary | ICD-10-CM

## 2013-11-22 DIAGNOSIS — M79606 Pain in leg, unspecified: Secondary | ICD-10-CM

## 2013-11-22 DIAGNOSIS — IMO0002 Reserved for concepts with insufficient information to code with codable children: Secondary | ICD-10-CM

## 2013-11-22 DIAGNOSIS — G8110 Spastic hemiplegia affecting unspecified side: Secondary | ICD-10-CM

## 2013-11-22 MED ORDER — IOHEXOL 180 MG/ML  SOLN
1.0000 mL | Freq: Once | INTRAMUSCULAR | Status: AC | PRN
  Administered 2013-11-22: 1 mL via EPIDURAL

## 2013-11-22 MED ORDER — METHYLPREDNISOLONE ACETATE 40 MG/ML INJ SUSP (RADIOLOG
120.0000 mg | Freq: Once | INTRAMUSCULAR | Status: AC
  Administered 2013-11-22: 120 mg via EPIDURAL

## 2013-11-22 NOTE — Discharge Instructions (Signed)

## 2013-11-23 IMAGING — CR DG LUMBAR SPINE COMPLETE 4+V
5 series · 5 of 5 positions shown · non-contrast
Comparison: None.

CLINICAL DATA: Increasing low back pain for 2 years.

LUMBAR SPINE - COMPLETE 4+ VIEW

[t l-spine a.p.]
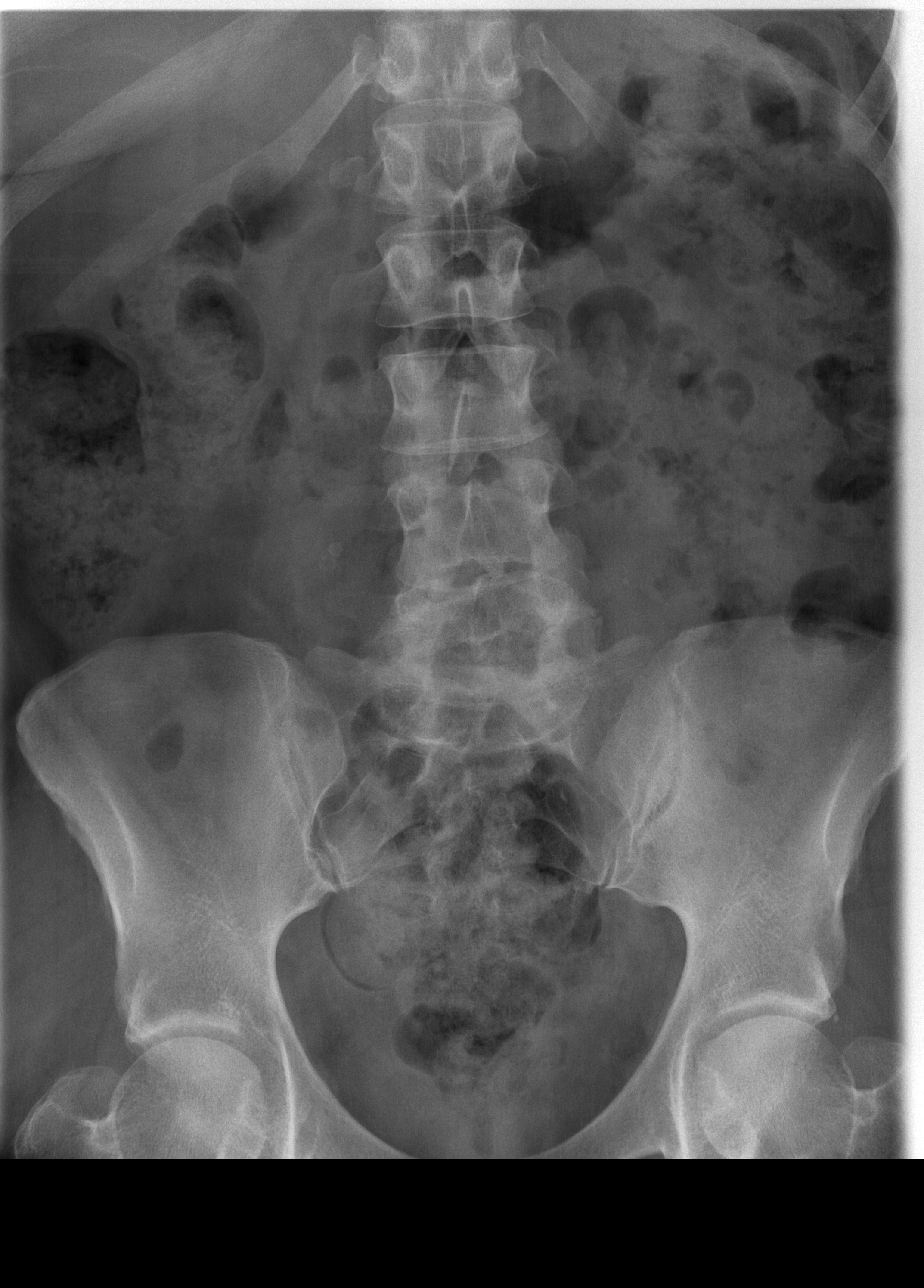

[t l-spine oblique exposure (1 of 2)]
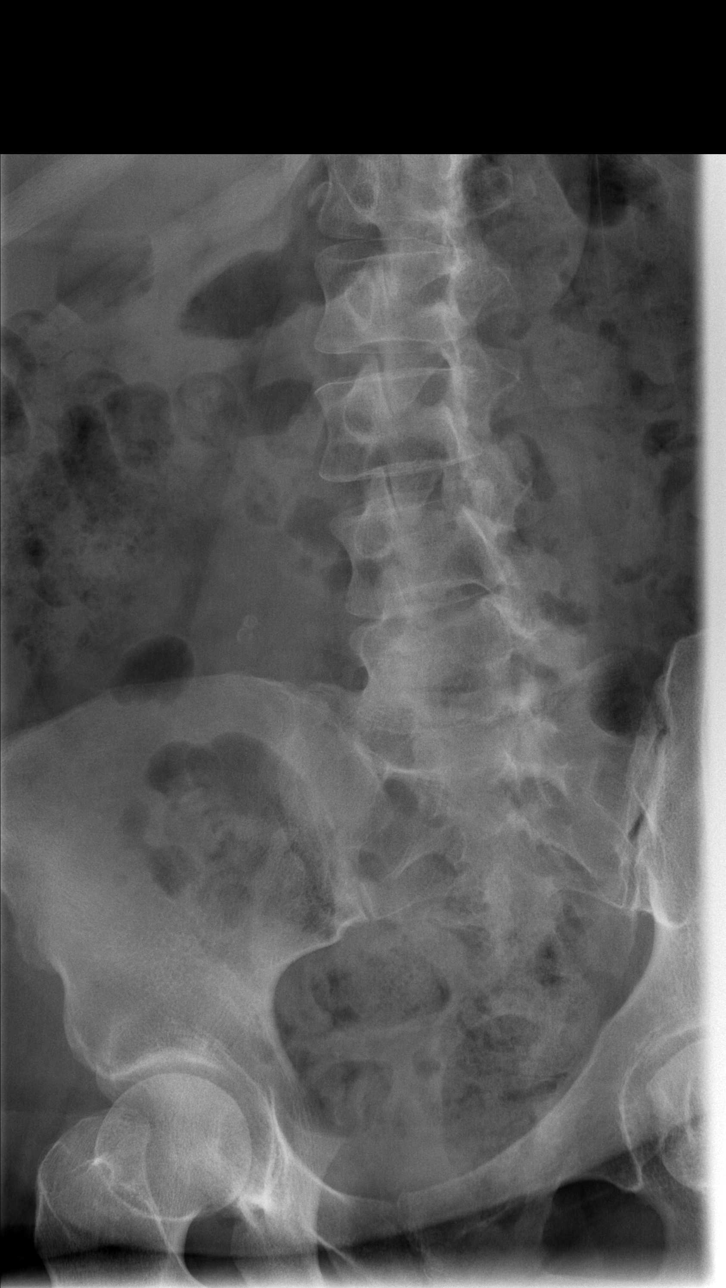

[t l-spine oblique exposure (2 of 2)]
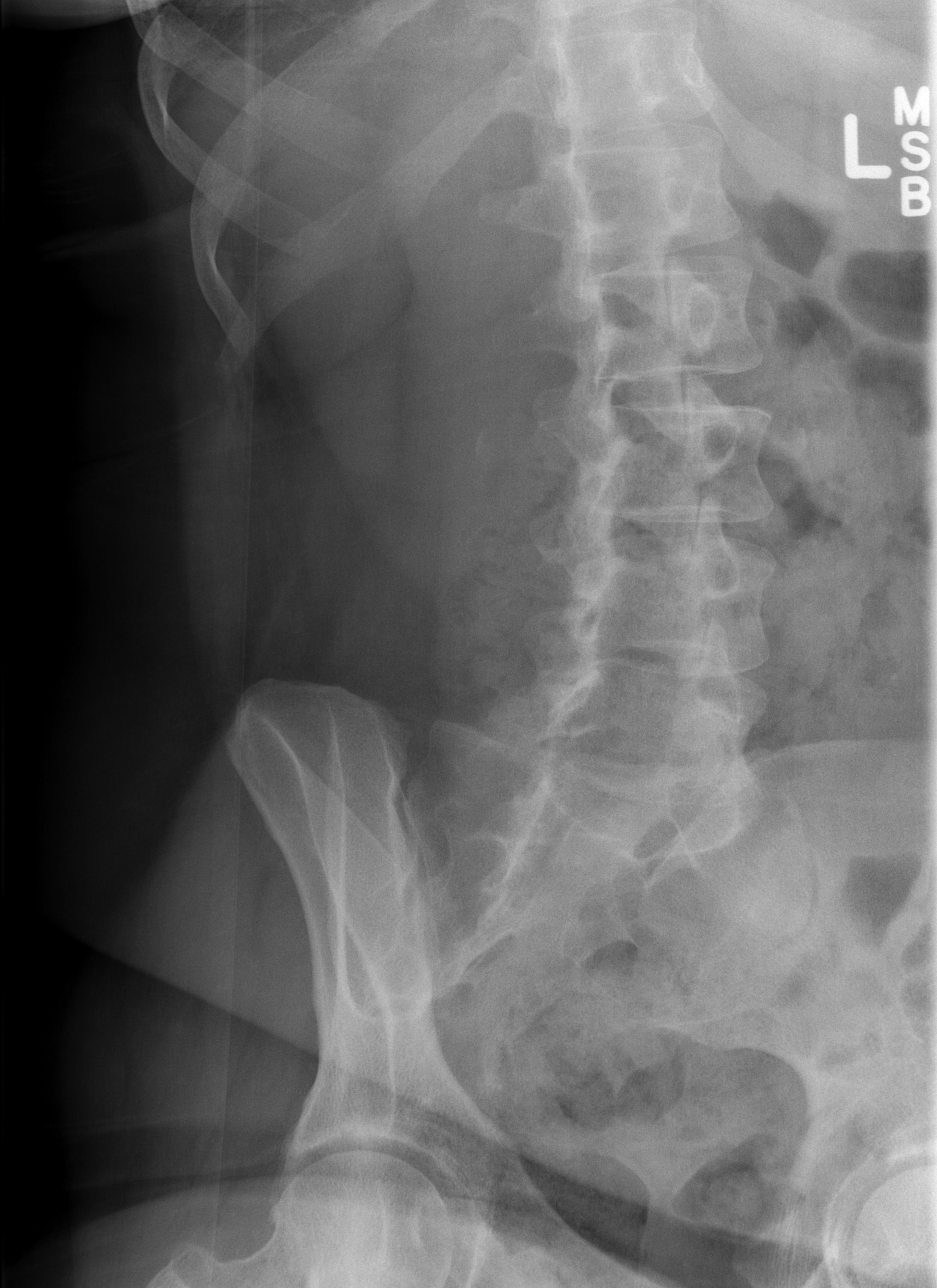

[t l-spine lat]
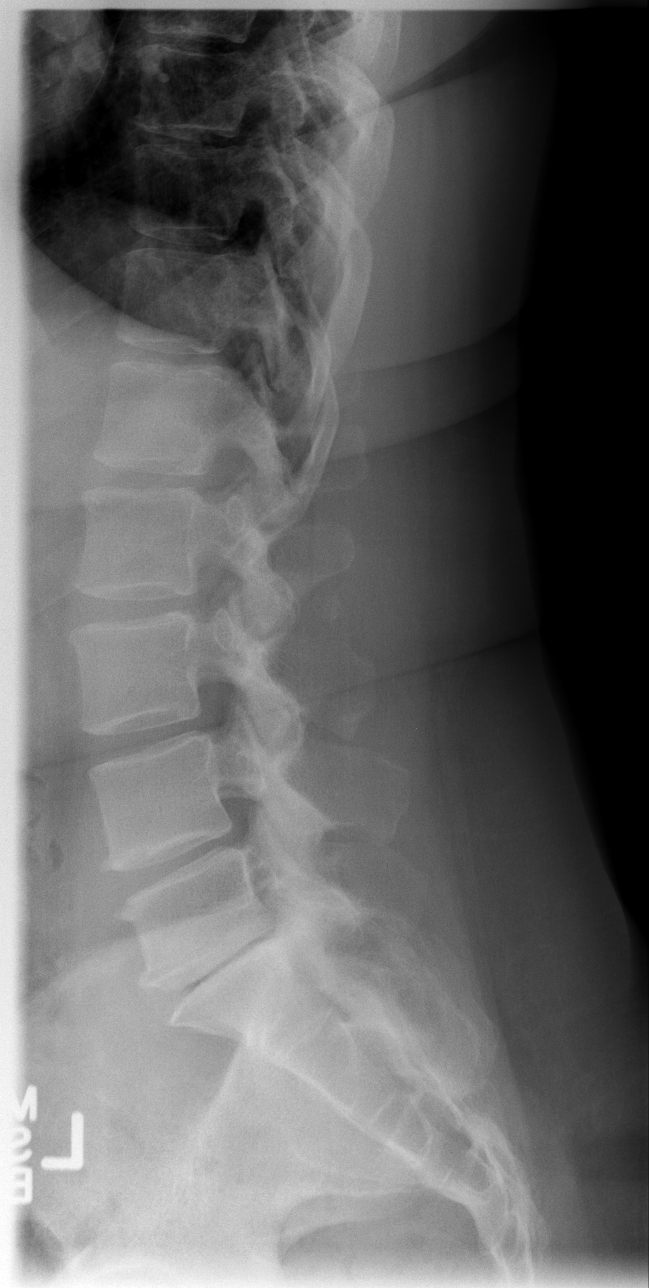

[t l-spine l5-s1 spot]
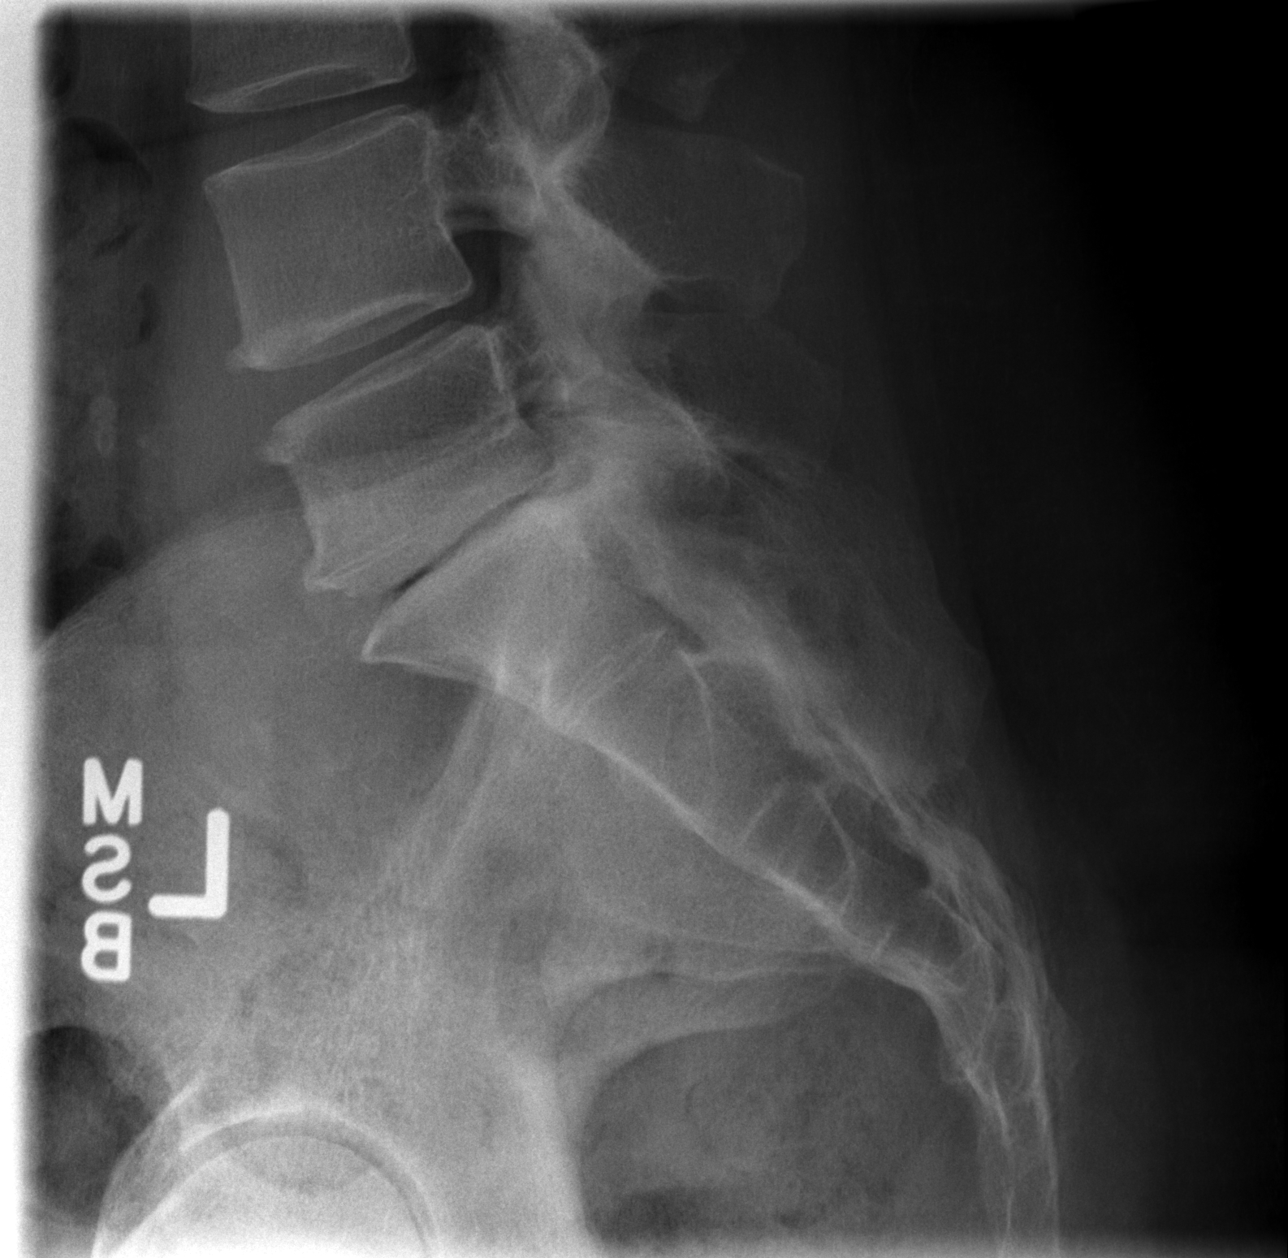

[5 of 5 positions shown; findings below may reference images not displayed]

FINDINGS: Advanced disc space narrowing is present at L5-S1.
Moderate disc space narrowing is noted at L4-L5 with mild disc
space narrowing noted at L3-L4.  There is lower lumbar facet
arthropathy. There is no compression fracture or malalignment.  No
aggressive osseous lesions are seen.  Moderate stool burden.
Unremarkable pelvis and lower ribs.
IMPRESSION: Degenerative disc disease as described, worst at L5-S1.

## 2014-03-22 ENCOUNTER — Other Ambulatory Visit: Payer: Self-pay | Admitting: Orthopedic Surgery

## 2014-03-22 DIAGNOSIS — M545 Low back pain, unspecified: Secondary | ICD-10-CM

## 2014-03-24 ENCOUNTER — Other Ambulatory Visit: Payer: Self-pay | Admitting: Orthopedic Surgery

## 2014-03-24 DIAGNOSIS — M545 Low back pain: Secondary | ICD-10-CM

## 2014-03-30 ENCOUNTER — Ambulatory Visit
Admission: RE | Admit: 2014-03-30 | Discharge: 2014-03-30 | Disposition: A | Discharge status: Home / Self Care | Payer: Medicaid Other | Source: Ambulatory Visit | Attending: Orthopedic Surgery | Admitting: Orthopedic Surgery

## 2014-03-30 DIAGNOSIS — M545 Low back pain, unspecified: Secondary | ICD-10-CM

## 2014-03-31 ENCOUNTER — Ambulatory Visit
Admission: RE | Admit: 2014-03-31 | Discharge: 2014-03-31 | Disposition: A | Discharge status: Home / Self Care | Payer: Medicaid Other | Source: Ambulatory Visit | Attending: Orthopedic Surgery | Admitting: Orthopedic Surgery

## 2014-03-31 VITALS — BP 171/86 | HR 63

## 2014-03-31 DIAGNOSIS — M545 Low back pain: Secondary | ICD-10-CM

## 2014-03-31 MED ORDER — IOHEXOL 180 MG/ML  SOLN
1.0000 mL | Freq: Once | INTRAMUSCULAR | Status: AC | PRN
Start: 2014-03-31 — End: 2014-03-31
  Administered 2014-03-31: 1 mL via EPIDURAL

## 2014-03-31 MED ORDER — METHYLPREDNISOLONE ACETATE 40 MG/ML INJ SUSP (RADIOLOG
120.0000 mg | Freq: Once | INTRAMUSCULAR | Status: AC
  Administered 2014-03-31: 120 mg via EPIDURAL

## 2014-03-31 NOTE — Discharge Instructions (Signed)

## 2014-05-10 ENCOUNTER — Other Ambulatory Visit: Payer: Self-pay | Admitting: Neurosurgery

## 2014-05-17 ENCOUNTER — Encounter (HOSPITAL_COMMUNITY): Payer: Self-pay | Admitting: Pharmacy Technician

## 2014-05-19 ENCOUNTER — Encounter (HOSPITAL_COMMUNITY): Payer: Self-pay

## 2014-05-19 ENCOUNTER — Encounter (HOSPITAL_COMMUNITY)
Admission: RE | Admit: 2014-05-19 | Discharge: 2014-05-19 | Disposition: A | Discharge status: Home / Self Care | Payer: Medicaid Other | Source: Ambulatory Visit | Attending: Neurosurgery | Admitting: Neurosurgery

## 2014-05-19 ENCOUNTER — Encounter (HOSPITAL_COMMUNITY): Payer: Self-pay | Admitting: Vascular Surgery

## 2014-05-19 DIAGNOSIS — M5126 Other intervertebral disc displacement, lumbar region: Secondary | ICD-10-CM | POA: Diagnosis present

## 2014-05-19 DIAGNOSIS — Z01812 Encounter for preprocedural laboratory examination: Secondary | ICD-10-CM | POA: Diagnosis present

## 2014-05-19 DIAGNOSIS — Z01810 Encounter for preprocedural cardiovascular examination: Secondary | ICD-10-CM | POA: Insufficient documentation

## 2014-05-19 HISTORY — DX: Weakness: R53.1

## 2014-05-19 HISTORY — DX: Gastro-esophageal reflux disease without esophagitis: K21.9

## 2014-05-19 HISTORY — DX: Sleep apnea, unspecified: G47.30

## 2014-05-19 HISTORY — DX: Insomnia, unspecified: G47.00

## 2014-05-19 LAB — BASIC METABOLIC PANEL
Anion gap: 11 (ref 5–15)
BUN: 27 mg/dL — ABNORMAL HIGH (ref 6–23)
CO2: 26 meq/L (ref 19–32)
Calcium: 9.2 mg/dL (ref 8.4–10.5)
Chloride: 103 mEq/L (ref 96–112)
Creatinine, Ser: 0.82 mg/dL (ref 0.50–1.10)
GFR calc Af Amer: >90 mL/min (ref >90)
GFR calc non Af Amer: 83 mL/min — ABNORMAL LOW (ref >90)
Glucose, Bld: 82 mg/dL (ref 70–99)
Potassium: 4.4 meq/L (ref 3.7–5.3)
Sodium: 140 mEq/L (ref 137–147)

## 2014-05-19 LAB — CBC
HCT: 39.4 % (ref 36.0–46.0)
Hemoglobin: 12.8 g/dL (ref 12.0–15.0)
MCH: 29.6 pg (ref 26.0–34.0)
MCHC: 32.5 g/dL (ref 30.0–36.0)
MCV: 91.2 fL (ref 78.0–100.0)
Platelets: 190 10*3/uL (ref 150–400)
RBC: 4.32 MIL/uL (ref 3.87–5.11)
RDW: 13.4 % (ref 11.5–15.5)
WBC: 9.3 10*3/uL (ref 4.0–10.5)

## 2014-05-19 LAB — SURGICAL PCR SCREEN
MRSA, PCR: NEGATIVE
Staphylococcus aureus: NEGATIVE

## 2014-05-19 NOTE — Progress Notes (Signed)
Patient reports that she was admitted overnight at [facility] ~ 1 year ago for chest pain. Patient reports everything was normal. States they done a stress test. Request stress test, d/c summary, EKG

## 2014-05-19 NOTE — Pre-Procedure Instructions (Signed)
Penny Murphy  05/19/2014   Your procedure is scheduled on:  Wednesday, Sept. 16, 2015  Report to [facility] Admitting at 6:30 AM.  Call this number if you have problems the morning of surgery: [phone]   Remember:   Do not eat food or drink liquids after midnight Tuesday, Sept. 15, 2015   Take these medicines the morning of surgery with A SIP OF WATER: Oxycodone, Hydrocodone   STOP Naproxen today   STOP/ Do not take Aspirin, Aleve, Naproxen, Advil, Ibuprofen, Motrin, Vitamins, Herbs, or Supplements starting today      Do not wear jewelry, make-up or nail polish.  Do not wear lotions, powders, or perfumes. You may not wear deodorant.  Do not shave 48 hours prior to surgery. Men may shave face and neck.  Do not bring valuables to the hospital.  [facility] is not responsible  for any belongings or valuables.               Contacts, dentures or bridgework may not be worn into surgery.  Leave suitcase in the car. After surgery it may be brought to your room.  For patients admitted to the hospital, discharge time is determined by your treatment team.               Patients discharged the day of surgery will not be allowed to drive home.  Name and phone number of your driver:   Special Instructions:  Special Instructions:Special Instructions: [facility] - Preparing for Surgery  Before surgery, you can play an important role.  Because skin is not sterile, your skin needs to be as free of germs as possible.  You can reduce the number of germs on you skin by washing with CHG (chlorahexidine gluconate) soap before surgery.  CHG is an antiseptic cleaner which kills germs and bonds with the skin to continue killing germs even after washing.  Please DO NOT use if you have an allergy to CHG or antibacterial soaps.  If your skin becomes reddened/irritated stop using the CHG and inform your nurse when you arrive at Short Stay.  Do not shave (including legs and underarms) for at  least 48 hours prior to the first CHG shower.  You may shave your face.  Please follow these instructions carefully:   1.  Shower with CHG Soap the night before surgery and the morning of Surgery.  2.  If you choose to wash your hair, wash your hair first as usual with your normal shampoo.  3.  After you shampoo, rinse your hair and body thoroughly to remove the Shampoo.  4.  Use CHG as you would any other liquid soap.  You can apply chg directly  to the skin and wash gently with scrungie or a clean washcloth.  5.  Apply the CHG Soap to your body ONLY FROM THE NECK DOWN.  Do not use on open wounds or open sores.  Avoid contact with your eyes, ears, mouth and genitals (private parts).  Wash genitals (private parts) with your normal soap.  6.  Wash thoroughly, paying special attention to the area where your surgery will be performed.  7.  Thoroughly rinse your body with warm water from the neck down.  8.  DO NOT shower/wash with your normal soap after using and rinsing off the CHG Soap.  9.  Pat yourself dry with a clean towel.            10.  Wear clean  pajamas.            11.  Place clean sheets on your bed the night of your first shower and do not sleep with pets.  Day of Surgery  Do not apply any lotions/deoderants the morning of surgery.  Please wear clean clothes to the hospital/surgery center.   Please read over the following fact sheets that you were given: Pain Booklet, Coughing and Deep Breathing, MRSA Information and Surgical Site Infection Prevention

## 2014-05-22 NOTE — Progress Notes (Signed)
Anesthesia Chart Review:  Patient is a 48 year old female scheduled for bilateral L4-5 microdiskectomy on 05/24/14 by Dr. Franky Macho.  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 BMI is consistent with obesity. She was hospitalized at [facility] on 09/14/13 for chest pain.  Records indicate she had a normal stress test at that time. No PCP is listed in Epic.  Records from [facility] list [place], [place].    EKG on 05/19/14 showed: NSR, cannot rule out anterior infarct (age undetermined). EKG appears stable when compared to 09/24/13 EKG from [facility].  [place] Cardiolite stress test on 09/24/13 [facility]) showed: NSR, EKG inconclusive for ischemia at 56% of maximum predicted HR, normal BP response, no arrhythmias.  Cardiolite portion read by the radiologist  is still pending, but report in discharge summary was documented as "normal." (Update: Cardiolite findings read as "Inferior, lateral, and septal infarcts are present involving the apical, mid, and basilar segments. There is no inducible ischemia along the anterior lateral wall. Global hypokinesis is present.Marland KitchenMarland KitchenThe calculated ejectino fraction is 73%." Impression: 1. Remote infarcts involving the inferior, lateral, and septal walls. 2. No inducible ischemia. 3. Moderate global hypokinesis. 4. The estimated ejection fraction is normal." Addendum read: "The areas of remote infarction may be exaggerated by attenuation.")  CXR from 09/24/13 [facility]) showed: Cardiomegaly is stable as compared to prior examination. Lungs are mildly hypoinflated. Bibasilar patchy obesity most likely reflects atelectasis and/or bronchial vascular crowding (correlate clinically to exclude infectious process). No definite focal infiltrate identified. No pulmonary edema or pleural effusion. No pneumothorax. No acute osseous abnormality.  Preoperative labs noted.   I  reviewed stress 09/2013 stress test findings with anesthesiologist Dr. Krista Blue.  Report is conflicting with discharge summary which lists it as "normal."  She will need either cardiology preoperative evaluation or cardiology input regarding stress test findings of prior infarct and hypokinesis prior to proceeding with elective surgery.  Darl Pikes at Dr. Sueanne Margarita office notified.  Velna Ochs [facility] Short Stay Center/Anesthesiology Phone [phone] 05/22/2014 3:07 PM

## 2014-05-24 ENCOUNTER — Ambulatory Visit (HOSPITAL_COMMUNITY): Admission: RE | Admit: 2014-05-24 | Payer: Medicaid Other | Source: Ambulatory Visit | Admitting: Neurosurgery

## 2014-05-24 ENCOUNTER — Encounter (HOSPITAL_COMMUNITY): Admission: RE | Payer: Self-pay | Source: Ambulatory Visit

## 2014-05-24 SURGERY — LUMBAR LAMINECTOMY/DECOMPRESSION MICRODISCECTOMY 1 LEVEL
Anesthesia: General | Laterality: Bilateral

## 2014-06-28 ENCOUNTER — Ambulatory Visit (INDEPENDENT_AMBULATORY_CARE_PROVIDER_SITE_OTHER): Payer: Medicaid Other | Admitting: Cardiovascular Disease

## 2014-06-28 ENCOUNTER — Encounter: Payer: Self-pay | Admitting: Cardiology

## 2014-06-28 VITALS — BP 166/100 | HR 73 | Ht 67.0 in | Wt 250.1 lb

## 2014-06-28 DIAGNOSIS — I10 Essential (primary) hypertension: Secondary | ICD-10-CM | POA: Insufficient documentation

## 2014-06-28 DIAGNOSIS — Z8673 Personal history of transient ischemic attack (TIA), and cerebral infarction without residual deficits: Secondary | ICD-10-CM | POA: Insufficient documentation

## 2014-06-28 DIAGNOSIS — G4730 Sleep apnea, unspecified: Secondary | ICD-10-CM | POA: Insufficient documentation

## 2014-06-28 DIAGNOSIS — Z01810 Encounter for preprocedural cardiovascular examination: Secondary | ICD-10-CM

## 2014-06-28 DIAGNOSIS — E669 Obesity, unspecified: Secondary | ICD-10-CM | POA: Insufficient documentation

## 2014-06-28 DIAGNOSIS — R9431 Abnormal electrocardiogram [ECG] [EKG]: Secondary | ICD-10-CM

## 2014-06-28 DIAGNOSIS — M519 Unspecified thoracic, thoracolumbar and lumbosacral intervertebral disc disorder: Secondary | ICD-10-CM

## 2014-06-28 NOTE — Assessment & Plan Note (Addendum)
Not controlled 

## 2014-06-28 NOTE — Assessment & Plan Note (Signed)
Stroke at age 48, no obvious etiology

## 2014-06-28 NOTE — Assessment & Plan Note (Signed)
Q wave lead V2.

## 2014-06-28 NOTE — Assessment & Plan Note (Signed)
C-pap intolerant 

## 2014-06-28 NOTE — Patient Instructions (Signed)
Your physician has requested that you have an echocardiogram. Echocardiography is a painless test that uses sound waves to create images of your heart. It provides your doctor with information about the size and shape of your heart and how well your heart's chambers and valves are working. This procedure takes approximately one hour. There are no restrictions for this procedure.  We will call you with result, Cardiac Clearance will be send out to your Doctor if you are cleared for surgery
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
## 2014-06-28 NOTE — Assessment & Plan Note (Signed)
Seen today for pre op cardiovascular clearance.

## 2014-06-28 NOTE — Assessment & Plan Note (Signed)
Referred for clearance by Dr Mikal Planeabell.

## 2014-06-30 ENCOUNTER — Ambulatory Visit (HOSPITAL_COMMUNITY)
Admission: RE | Admit: 2014-06-30 | Discharge: 2014-06-30 | Disposition: A | Discharge status: Home / Self Care | Payer: Medicaid Other | Source: Ambulatory Visit | Attending: Cardiovascular Disease | Admitting: Cardiovascular Disease

## 2014-06-30 DIAGNOSIS — I059 Rheumatic mitral valve disease, unspecified: Secondary | ICD-10-CM

## 2014-06-30 DIAGNOSIS — Z01818 Encounter for other preprocedural examination: Secondary | ICD-10-CM | POA: Insufficient documentation

## 2014-06-30 DIAGNOSIS — Z8673 Personal history of transient ischemic attack (TIA), and cerebral infarction without residual deficits: Secondary | ICD-10-CM

## 2014-06-30 DIAGNOSIS — R9431 Abnormal electrocardiogram [ECG] [EKG]: Secondary | ICD-10-CM

## 2014-06-30 DIAGNOSIS — I10 Essential (primary) hypertension: Secondary | ICD-10-CM | POA: Insufficient documentation

## 2014-06-30 DIAGNOSIS — Z01810 Encounter for preprocedural cardiovascular examination: Secondary | ICD-10-CM

## 2014-06-30 NOTE — Progress Notes (Signed)
2D Echocardiogram Complete.  06/30/2014   Wash Nienhaus, RDCS  

## 2014-07-04 ENCOUNTER — Telehealth: Payer: Self-pay | Admitting: Cardiology

## 2014-07-04 ENCOUNTER — Other Ambulatory Visit (HOSPITAL_COMMUNITY): Payer: Self-pay | Admitting: Neurosurgery

## 2014-07-04 NOTE — Telephone Encounter (Signed)
Spoke with Darl PikesSusan - per echo report, patient is cleared for surgery.

## 2014-07-05 ENCOUNTER — Encounter (HOSPITAL_COMMUNITY): Payer: Self-pay | Admitting: Pharmacy Technician

## 2014-07-12 NOTE — Pre-Procedure Instructions (Signed)
Penny Murphy  07/12/2014   Your procedure is scheduled on: Wednesday, July 19, 2014  Report to [facility] Admitting at 1130  AM per MD  Call this number if you have problems the morning of surgery: [phone]   Remember:   Do not eat food or drink liquids after midnight Tuesday, July 18, 2014   Take these medicines the morning of surgery with A SIP OF WATER: metoprolol (LOPRESSOR), Oxycodone and HYDROcodone (Norco) for pain if needed for pain. Stop taking Aspirin, vitamins, and herbal medications. Do not take any NSAIDs ie: Ibuprofen, Advil, Naproxen or any medication containing Aspirin; stop 5 days prior to procedure ( Friday, July 14, 2014).  Do not wear jewelry, make-up or nail polish.  Do not wear lotions, powders, or perfumes. You may not wear deodorant.  Do not shave 48 hours prior to surgery.   Do not bring valuables to the hospital.  [facility] is not responsible for any belongings or valuables.               Contacts, dentures or bridgework may not be worn into surgery.  Leave suitcase in the car. After surgery it may be brought to your room.  For patients admitted to the hospital, discharge time is determined by your treatment team.               Patients discharged the day of surgery will not be allowed to drive home.  Name and phone number of your driver:   Special Instructions:  Special Instructions:Special Instructions: [facility] - Preparing for Surgery  Before surgery, you can play an important role.  Because skin is not sterile, your skin needs to be as free of germs as possible.  You can reduce the number of germs on you skin by washing with CHG (chlorahexidine gluconate) soap before surgery.  CHG is an antiseptic cleaner which kills germs and bonds with the skin to continue killing germs even after washing.  Please DO NOT use if you have an allergy to CHG or antibacterial soaps.  If your skin becomes reddened/irritated stop using the CHG and  inform your nurse when you arrive at Short Stay.  Do not shave (including legs and underarms) for at least 48 hours prior to the first CHG shower.  You may shave your face.  Please follow these instructions carefully:   1.  Shower with CHG Soap the night before surgery and the morning of Surgery.  2.  If you choose to wash your hair, wash your hair first as usual with your normal shampoo.  3.  After you shampoo, rinse your hair and body thoroughly to remove the Shampoo.  4.  Use CHG as you would any other liquid soap.  You can apply chg directly  to the skin and wash gently with scrungie or a clean washcloth.  5.  Apply the CHG Soap to your body ONLY FROM THE NECK DOWN.  Do not use on open wounds or open sores.  Avoid contact with your eyes, ears, mouth and genitals (private parts).  Wash genitals (private parts) with your normal soap.  6.  Wash thoroughly, paying special attention to the area where your surgery will be performed.  7.  Thoroughly rinse your body with warm water from the neck down.  8.  DO NOT shower/wash with your normal soap after using and rinsing off the CHG Soap.  9.  Pat yourself dry with a clean towel.  10.  Wear clean pajamas.            11.  Place clean sheets on your bed the night of your first shower and do not sleep with pets.  Day of Surgery  Do not apply any lotions/deodorants the morning of surgery.  Please wear clean clothes to the hospital/surgery center.   Please read over the following fact sheets that you were given: Pain Booklet, Coughing and Deep Breathing, MRSA Information and Surgical Site Infection Prevention

## 2014-07-13 ENCOUNTER — Encounter (HOSPITAL_COMMUNITY)
Admission: RE | Admit: 2014-07-13 | Discharge: 2014-07-13 | Disposition: A | Discharge status: Home / Self Care | Payer: Medicaid Other | Source: Ambulatory Visit | Attending: Neurosurgery | Admitting: Neurosurgery

## 2014-07-13 ENCOUNTER — Encounter (HOSPITAL_COMMUNITY): Payer: Self-pay

## 2014-07-13 DIAGNOSIS — M5126 Other intervertebral disc displacement, lumbar region: Secondary | ICD-10-CM | POA: Insufficient documentation

## 2014-07-13 DIAGNOSIS — Z01812 Encounter for preprocedural laboratory examination: Secondary | ICD-10-CM | POA: Diagnosis present

## 2014-07-13 HISTORY — DX: Essential (primary) hypertension: I10

## 2014-07-13 LAB — BASIC METABOLIC PANEL
Anion gap: 9 (ref 5–15)
BUN: 15 mg/dL (ref 6–23)
CO2: 27 mEq/L (ref 19–32)
Calcium: 9.1 mg/dL (ref 8.4–10.5)
Chloride: 106 meq/L (ref 96–112)
Creatinine, Ser: 0.73 mg/dL (ref 0.50–1.10)
GFR calc Af Amer: >90 mL/min (ref >90)
GFR calc non Af Amer: >90 mL/min (ref >90)
Glucose, Bld: 90 mg/dL (ref 70–99)
Potassium: 4.1 meq/L (ref 3.7–5.3)
Sodium: 142 meq/L (ref 137–147)

## 2014-07-13 LAB — SURGICAL PCR SCREEN
MRSA, PCR: NEGATIVE
Staphylococcus aureus: NEGATIVE

## 2014-07-13 LAB — CBC
HCT: 40.4 % (ref 36.0–46.0)
Hemoglobin: 13.1 g/dL (ref 12.0–15.0)
MCH: 29 pg (ref 26.0–34.0)
MCHC: 32.4 g/dL (ref 30.0–36.0)
MCV: 89.6 fL (ref 78.0–100.0)
Platelets: 190 10*3/uL (ref 150–400)
RBC: 4.51 MIL/uL (ref 3.87–5.11)
RDW: 13.3 % (ref 11.5–15.5)
WBC: 7.6 10*3/uL (ref 4.0–10.5)

## 2014-07-13 NOTE — Progress Notes (Signed)
Anesthesia follow-up: See my note from 05/22/14.  Surgery was postponed until 07/19/14 to allow for preoperative cardiology evaluation.  She has since seen Corine ShelterLuke Kilroy, PA-C with [facility].  He discussed her upcoming surgery and 09/24/13 stress test results from [facility] (scanned under Media tab) with cardiologist Dr. Royann Shiversroitoru.  An echo was ordered and done on 06/30/14 showing:  - Left ventricle: The cavity size was normal. There was moderate concentric hypertrophy. Systolic function was normal. Theestimated ejection fraction was in the range of 55% to 60%. Wallmotion was normal; there were no regional wall motion abnormalities. Doppler parameters are consistent with abnormal left ventricular relaxation (grade 1 diastolic dysfunction). - Mitral valve: There was mild regurgitation. - Tricuspid valve: There was mild regurgitation.  Patient was subsequently cleared for back surgery from a cardiac standpoint.  EKG on 06/28/14: NSR, cannot rule out anterior infarct (age undetermined). T wave inversion in V1-3.  Preoperative labs noted.  If no acute changes then plan to proceed.  Penny Ochsllison Omeka Holben, PA-C [facility] Short Stay Center/Anesthesiology Phone [phone] 07/13/2014 12:24 PM

## 2014-07-18 MED ORDER — CEFAZOLIN SODIUM-DEXTROSE 2-3 GM-% IV SOLR
2.0000 g | INTRAVENOUS | Status: AC
  Administered 2014-07-19: 2 g via INTRAVENOUS
  Filled 2014-07-18: qty 50

## 2014-07-19 ENCOUNTER — Encounter (HOSPITAL_COMMUNITY)
Admission: RE | Disposition: A | Discharge status: Home / Self Care | Payer: Self-pay | Source: Ambulatory Visit | Attending: Neurosurgery

## 2014-07-19 ENCOUNTER — Ambulatory Visit (HOSPITAL_COMMUNITY): Payer: Medicaid Other

## 2014-07-19 ENCOUNTER — Ambulatory Visit (HOSPITAL_COMMUNITY): Payer: Medicaid Other | Admitting: Vascular Surgery

## 2014-07-19 ENCOUNTER — Encounter (HOSPITAL_COMMUNITY): Payer: Self-pay | Admitting: *Deleted

## 2014-07-19 ENCOUNTER — Ambulatory Visit (HOSPITAL_COMMUNITY)
Admission: RE | Admit: 2014-07-19 | Discharge: 2014-07-20 | Disposition: A | Discharge status: Home / Self Care | Payer: Medicaid Other | Source: Ambulatory Visit | Attending: Neurosurgery | Admitting: Neurosurgery

## 2014-07-19 ENCOUNTER — Ambulatory Visit (HOSPITAL_COMMUNITY): Payer: Medicaid Other | Admitting: Anesthesiology

## 2014-07-19 DIAGNOSIS — Z79899 Other long term (current) drug therapy: Secondary | ICD-10-CM | POA: Diagnosis not present

## 2014-07-19 DIAGNOSIS — Z8673 Personal history of transient ischemic attack (TIA), and cerebral infarction without residual deficits: Secondary | ICD-10-CM | POA: Insufficient documentation

## 2014-07-19 DIAGNOSIS — E780 Pure hypercholesterolemia: Secondary | ICD-10-CM | POA: Insufficient documentation

## 2014-07-19 DIAGNOSIS — M5126 Other intervertebral disc displacement, lumbar region: Secondary | ICD-10-CM | POA: Diagnosis present

## 2014-07-19 DIAGNOSIS — F419 Anxiety disorder, unspecified: Secondary | ICD-10-CM | POA: Diagnosis not present

## 2014-07-19 DIAGNOSIS — I10 Essential (primary) hypertension: Secondary | ICD-10-CM | POA: Diagnosis not present

## 2014-07-19 DIAGNOSIS — M519 Unspecified thoracic, thoracolumbar and lumbosacral intervertebral disc disorder: Secondary | ICD-10-CM

## 2014-07-19 HISTORY — PX: LUMBAR LAMINECTOMY/DECOMPRESSION MICRODISCECTOMY: SHX5026

## 2014-07-19 SURGERY — LUMBAR LAMINECTOMY/DECOMPRESSION MICRODISCECTOMY 1 LEVEL
Anesthesia: General | Laterality: Bilateral

## 2014-07-19 MED ORDER — LACTATED RINGERS IV SOLN
INTRAVENOUS | Status: DC
Start: 2014-07-19 — End: 2014-07-19
  Administered 2014-07-19: 08:00:00 via INTRAVENOUS

## 2014-07-19 MED ORDER — CYCLOBENZAPRINE HCL 10 MG PO TABS
10.0000 mg | ORAL_TABLET | Freq: Three times a day (TID) | ORAL | Status: DC | PRN
  Administered 2014-07-19 – 2014-07-20 (×2): 10 mg via ORAL
  Filled 2014-07-19 (×2): qty 1

## 2014-07-19 MED ORDER — ACETAMINOPHEN 325 MG PO TABS: 650.0000 mg | ORAL_TABLET | ORAL | Status: DC | PRN

## 2014-07-19 MED ORDER — LIDOCAINE HCL (CARDIAC) 20 MG/ML IV SOLN
INTRAVENOUS | Status: AC
  Filled 2014-07-19: qty 5

## 2014-07-19 MED ORDER — OXYCODONE HCL 5 MG PO TABS: 5.0000 mg | ORAL_TABLET | Freq: Once | ORAL | Status: DC | PRN

## 2014-07-19 MED ORDER — ROCURONIUM BROMIDE 50 MG/5ML IV SOLN
INTRAVENOUS | Status: AC
  Filled 2014-07-19: qty 1

## 2014-07-19 MED ORDER — KETOROLAC TROMETHAMINE 30 MG/ML IJ SOLN
30.0000 mg | Freq: Four times a day (QID) | INTRAMUSCULAR | Status: DC
  Administered 2014-07-19 – 2014-07-20 (×4): 30 mg via INTRAVENOUS
  Filled 2014-07-19 (×7): qty 1

## 2014-07-19 MED ORDER — LIDOCAINE-EPINEPHRINE 0.5 %-1:200000 IJ SOLN
INTRAMUSCULAR | Status: DC | PRN
  Administered 2014-07-19: 20 mL

## 2014-07-19 MED ORDER — LIDOCAINE HCL (CARDIAC) 20 MG/ML IV SOLN
INTRAVENOUS | Status: DC | PRN
  Administered 2014-07-19: 100 mg via INTRAVENOUS

## 2014-07-19 MED ORDER — THROMBIN 5000 UNITS EX SOLR
CUTANEOUS | Status: DC | PRN
  Administered 2014-07-19 (×2): 5000 [IU] via TOPICAL

## 2014-07-19 MED ORDER — ROCURONIUM BROMIDE 100 MG/10ML IV SOLN
INTRAVENOUS | Status: DC | PRN
  Administered 2014-07-19: 50 mg via INTRAVENOUS
  Administered 2014-07-19: 20 mg via INTRAVENOUS

## 2014-07-19 MED ORDER — POTASSIUM CHLORIDE IN NACL 20-0.9 MEQ/L-% IV SOLN
INTRAVENOUS | Status: DC
  Filled 2014-07-19 (×4): qty 1000

## 2014-07-19 MED ORDER — LIDOCAINE HCL 4 % MT SOLN
OROMUCOSAL | Status: DC | PRN
  Administered 2014-07-19: 3 mL via TOPICAL

## 2014-07-19 MED ORDER — PROPOFOL 10 MG/ML IV BOLUS
INTRAVENOUS | Status: AC
  Filled 2014-07-19: qty 20

## 2014-07-19 MED ORDER — MENTHOL 3 MG MT LOZG: 1.0000 | LOZENGE | OROMUCOSAL | Status: DC | PRN

## 2014-07-19 MED ORDER — KETOROLAC TROMETHAMINE 30 MG/ML IJ SOLN
INTRAMUSCULAR | Status: AC
  Filled 2014-07-19: qty 1

## 2014-07-19 MED ORDER — HYDROMORPHONE HCL 1 MG/ML IJ SOLN
INTRAMUSCULAR | Status: AC
  Filled 2014-07-19: qty 1

## 2014-07-19 MED ORDER — LACTATED RINGERS IV SOLN
INTRAVENOUS | Status: DC | PRN
  Administered 2014-07-19 (×2): via INTRAVENOUS

## 2014-07-19 MED ORDER — PROPOFOL 10 MG/ML IV BOLUS
INTRAVENOUS | Status: DC | PRN
  Administered 2014-07-19: 20 mg via INTRAVENOUS
  Administered 2014-07-19: 130 mg via INTRAVENOUS
  Administered 2014-07-19: 50 mg via INTRAVENOUS

## 2014-07-19 MED ORDER — ONDANSETRON HCL 4 MG/2ML IJ SOLN
INTRAMUSCULAR | Status: DC | PRN
  Administered 2014-07-19: 4 mg via INTRAVENOUS

## 2014-07-19 MED ORDER — PHENYLEPHRINE HCL 10 MG/ML IJ SOLN
10.0000 mg | INTRAVENOUS | Status: DC | PRN
  Administered 2014-07-19: 40 ug/min via INTRAVENOUS

## 2014-07-19 MED ORDER — MIDAZOLAM HCL 5 MG/5ML IJ SOLN
INTRAMUSCULAR | Status: DC | PRN
  Administered 2014-07-19: 2 mg via INTRAVENOUS

## 2014-07-19 MED ORDER — SODIUM CHLORIDE 0.9 % IJ SOLN
INTRAMUSCULAR | Status: AC
  Filled 2014-07-19: qty 10

## 2014-07-19 MED ORDER — NEOSTIGMINE METHYLSULFATE 10 MG/10ML IV SOLN
INTRAVENOUS | Status: DC | PRN
  Administered 2014-07-19: 3 mg via INTRAVENOUS

## 2014-07-19 MED ORDER — SUFENTANIL CITRATE 50 MCG/ML IV SOLN
INTRAVENOUS | Status: DC | PRN
  Administered 2014-07-19: 5 ug via INTRAVENOUS
  Administered 2014-07-19: 20 ug via INTRAVENOUS

## 2014-07-19 MED ORDER — NEOSTIGMINE METHYLSULFATE 10 MG/10ML IV SOLN
INTRAVENOUS | Status: AC
  Filled 2014-07-19: qty 1

## 2014-07-19 MED ORDER — DEXAMETHASONE SODIUM PHOSPHATE 10 MG/ML IJ SOLN
INTRAMUSCULAR | Status: AC
  Filled 2014-07-19: qty 1

## 2014-07-19 MED ORDER — SENNA 8.6 MG PO TABS
1.0000 | ORAL_TABLET | Freq: Two times a day (BID) | ORAL | Status: DC
  Administered 2014-07-19 – 2014-07-20 (×2): 8.6 mg via ORAL
  Filled 2014-07-19 (×3): qty 1

## 2014-07-19 MED ORDER — DEXAMETHASONE SODIUM PHOSPHATE 10 MG/ML IJ SOLN
INTRAMUSCULAR | Status: DC | PRN
  Administered 2014-07-19: 5 mg via INTRAVENOUS

## 2014-07-19 MED ORDER — METOPROLOL TARTRATE 50 MG PO TABS
50.0000 mg | ORAL_TABLET | Freq: Two times a day (BID) | ORAL | Status: DC
  Administered 2014-07-19 – 2014-07-20 (×2): 50 mg via ORAL
  Filled 2014-07-19 (×3): qty 1

## 2014-07-19 MED ORDER — ALPRAZOLAM 0.5 MG PO TABS: 1.0000 mg | ORAL_TABLET | Freq: Every evening | ORAL | Status: DC | PRN

## 2014-07-19 MED ORDER — ACETAMINOPHEN 650 MG RE SUPP: 650.0000 mg | RECTAL | Status: DC | PRN

## 2014-07-19 MED ORDER — SODIUM CHLORIDE 0.9 % IJ SOLN
3.0000 mL | Freq: Two times a day (BID) | INTRAMUSCULAR | Status: DC
  Administered 2014-07-19 (×2): 3 mL via INTRAVENOUS

## 2014-07-19 MED ORDER — SUCCINYLCHOLINE CHLORIDE 20 MG/ML IJ SOLN
INTRAMUSCULAR | Status: AC
  Filled 2014-07-19: qty 1

## 2014-07-19 MED ORDER — GLYCOPYRROLATE 0.2 MG/ML IJ SOLN
INTRAMUSCULAR | Status: AC
  Filled 2014-07-19: qty 2

## 2014-07-19 MED ORDER — GLYCOPYRROLATE 0.2 MG/ML IJ SOLN
INTRAMUSCULAR | Status: DC | PRN
  Administered 2014-07-19: .4 mg via INTRAVENOUS

## 2014-07-19 MED ORDER — ONDANSETRON HCL 4 MG/2ML IJ SOLN
4.0000 mg | INTRAMUSCULAR | Status: DC | PRN
  Administered 2014-07-19: 4 mg via INTRAVENOUS
  Filled 2014-07-19: qty 2

## 2014-07-19 MED ORDER — OXYCODONE HCL 5 MG/5ML PO SOLN: 5.0000 mg | Freq: Once | ORAL | Status: DC | PRN

## 2014-07-19 MED ORDER — POLYETHYLENE GLYCOL 3350 17 G PO PACK
17.0000 g | PACK | Freq: Every day | ORAL | Status: DC | PRN
  Filled 2014-07-19: qty 1

## 2014-07-19 MED ORDER — MIDAZOLAM HCL 2 MG/2ML IJ SOLN
INTRAMUSCULAR | Status: AC
  Filled 2014-07-19: qty 2

## 2014-07-19 MED ORDER — BUPIVACAINE HCL (PF) 0.5 % IJ SOLN
INTRAMUSCULAR | Status: DC | PRN
  Administered 2014-07-19: 10 mL

## 2014-07-19 MED ORDER — HYDROMORPHONE HCL 1 MG/ML IJ SOLN
0.2500 mg | INTRAMUSCULAR | Status: DC | PRN
  Administered 2014-07-19 (×2): 0.5 mg via INTRAVENOUS

## 2014-07-19 MED ORDER — SUFENTANIL CITRATE 50 MCG/ML IV SOLN
INTRAVENOUS | Status: AC
  Filled 2014-07-19: qty 1

## 2014-07-19 MED ORDER — SODIUM CHLORIDE 0.9 % IJ SOLN: 3.0000 mL | INTRAMUSCULAR | Status: DC | PRN

## 2014-07-19 MED ORDER — 0.9 % SODIUM CHLORIDE (POUR BTL) OPTIME
TOPICAL | Status: DC | PRN
  Administered 2014-07-19: 1000 mL

## 2014-07-19 MED ORDER — OXYCODONE-ACETAMINOPHEN 5-325 MG PO TABS
1.0000 | ORAL_TABLET | ORAL | Status: DC | PRN
  Administered 2014-07-19 – 2014-07-20 (×2): 2 via ORAL
  Filled 2014-07-19 (×2): qty 2

## 2014-07-19 MED ORDER — PHENYLEPHRINE HCL 10 MG/ML IJ SOLN
INTRAMUSCULAR | Status: DC | PRN
  Administered 2014-07-19: 120 ug via INTRAVENOUS
  Administered 2014-07-19 (×2): 80 ug via INTRAVENOUS

## 2014-07-19 MED ORDER — HEMOSTATIC AGENTS (NO CHARGE) OPTIME
TOPICAL | Status: DC | PRN
  Administered 2014-07-19: 1 via TOPICAL

## 2014-07-19 MED ORDER — MORPHINE SULFATE 2 MG/ML IJ SOLN
1.0000 mg | INTRAMUSCULAR | Status: DC | PRN
  Administered 2014-07-19: 2 mg via INTRAVENOUS
  Filled 2014-07-19: qty 1

## 2014-07-19 MED ORDER — ONDANSETRON HCL 4 MG/2ML IJ SOLN: 4.0000 mg | Freq: Four times a day (QID) | INTRAMUSCULAR | Status: DC | PRN

## 2014-07-19 MED ORDER — HYDROCODONE-ACETAMINOPHEN 5-325 MG PO TABS: 1.0000 | ORAL_TABLET | ORAL | Status: DC | PRN

## 2014-07-19 MED ORDER — PHENOL 1.4 % MT LIQD: 1.0000 | OROMUCOSAL | Status: DC | PRN

## 2014-07-19 SURGICAL SUPPLY — 55 items
BAG DECANTER FOR FLEXI CONT (MISCELLANEOUS) ×3 IMPLANT
BENZOIN TINCTURE PRP APPL 2/3 (GAUZE/BANDAGES/DRESSINGS) IMPLANT
BLADE CLIPPER SURG (BLADE) IMPLANT
BUR MATCHSTICK NEURO 3.0 LAGG (BURR) ×3 IMPLANT
CANISTER SUCT 3000ML (MISCELLANEOUS) ×3 IMPLANT
CLOSURE WOUND 1/2 X4 (GAUZE/BANDAGES/DRESSINGS)
CONT SPEC 4OZ CLIKSEAL STRL BL (MISCELLANEOUS) ×3 IMPLANT
DECANTER SPIKE VIAL GLASS SM (MISCELLANEOUS) ×3 IMPLANT
DRAPE LAPAROTOMY 100X72X124 (DRAPES) ×3 IMPLANT
DRAPE MICROSCOPE LEICA (MISCELLANEOUS) ×3 IMPLANT
DRAPE POUCH INSTRU U-SHP 10X18 (DRAPES) ×3 IMPLANT
DRAPE SURG 17X23 STRL (DRAPES) ×3 IMPLANT
DRSG OPSITE POSTOP 4X6 (GAUZE/BANDAGES/DRESSINGS) ×3 IMPLANT
DURAPREP 26ML APPLICATOR (WOUND CARE) ×3 IMPLANT
ELECT REM PT RETURN 9FT ADLT (ELECTROSURGICAL) ×3
ELECTRODE REM PT RTRN 9FT ADLT (ELECTROSURGICAL) ×1 IMPLANT
GAUZE SPONGE 4X4 12PLY STRL (GAUZE/BANDAGES/DRESSINGS) IMPLANT
GAUZE SPONGE 4X4 16PLY XRAY LF (GAUZE/BANDAGES/DRESSINGS) IMPLANT
GLOVE BIO SURGEON STRL SZ 6.5 (GLOVE) ×2 IMPLANT
GLOVE BIO SURGEON STRL SZ8 (GLOVE) ×3 IMPLANT
GLOVE BIO SURGEONS STRL SZ 6.5 (GLOVE) ×1
GLOVE BIOGEL PI IND STRL 7.0 (GLOVE) ×1 IMPLANT
GLOVE BIOGEL PI INDICATOR 7.0 (GLOVE) ×2
GLOVE ECLIPSE 6.5 STRL STRAW (GLOVE) ×3 IMPLANT
GLOVE EXAM NITRILE LRG STRL (GLOVE) IMPLANT
GLOVE EXAM NITRILE MD LF STRL (GLOVE) IMPLANT
GLOVE EXAM NITRILE XL STR (GLOVE) IMPLANT
GLOVE EXAM NITRILE XS STR PU (GLOVE) IMPLANT
GLOVE INDICATOR 7.5 STRL GRN (GLOVE) ×3 IMPLANT
GLOVE SS N UNI LF 8.5 STRL (GLOVE) ×3 IMPLANT
GOWN STRL REUS W/ TWL LRG LVL3 (GOWN DISPOSABLE) ×2 IMPLANT
GOWN STRL REUS W/ TWL XL LVL3 (GOWN DISPOSABLE) ×1 IMPLANT
GOWN STRL REUS W/TWL 2XL LVL3 (GOWN DISPOSABLE) IMPLANT
GOWN STRL REUS W/TWL LRG LVL3 (GOWN DISPOSABLE) ×4
GOWN STRL REUS W/TWL XL LVL3 (GOWN DISPOSABLE) ×2
KIT BASIN OR (CUSTOM PROCEDURE TRAY) ×3 IMPLANT
KIT ROOM TURNOVER OR (KITS) ×3 IMPLANT
LIQUID BAND (GAUZE/BANDAGES/DRESSINGS) ×3 IMPLANT
NEEDLE HYPO 25X1 1.5 SAFETY (NEEDLE) ×3 IMPLANT
NEEDLE SPNL 18GX3.5 QUINCKE PK (NEEDLE) IMPLANT
NS IRRIG 1000ML POUR BTL (IV SOLUTION) ×3 IMPLANT
PACK LAMINECTOMY NEURO (CUSTOM PROCEDURE TRAY) ×3 IMPLANT
PAD ARMBOARD 7.5X6 YLW CONV (MISCELLANEOUS) ×9 IMPLANT
RUBBERBAND STERILE (MISCELLANEOUS) ×6 IMPLANT
SPONGE LAP 4X18 X RAY DECT (DISPOSABLE) IMPLANT
SPONGE SURGIFOAM ABS GEL SZ50 (HEMOSTASIS) ×3 IMPLANT
STRIP CLOSURE SKIN 1/2X4 (GAUZE/BANDAGES/DRESSINGS) IMPLANT
SUT VIC AB 0 CT1 18XCR BRD8 (SUTURE) ×1 IMPLANT
SUT VIC AB 0 CT1 8-18 (SUTURE) ×2
SUT VIC AB 2-0 CT1 18 (SUTURE) ×3 IMPLANT
SUT VIC AB 3-0 SH 8-18 (SUTURE) ×6 IMPLANT
SYR 20ML ECCENTRIC (SYRINGE) ×3 IMPLANT
TOWEL OR 17X24 6PK STRL BLUE (TOWEL DISPOSABLE) ×3 IMPLANT
TOWEL OR 17X26 10 PK STRL BLUE (TOWEL DISPOSABLE) ×3 IMPLANT
WATER STERILE IRR 1000ML POUR (IV SOLUTION) ×3 IMPLANT

## 2014-07-19 NOTE — Transfer of Care (Signed)
Immediate Anesthesia Transfer of Care Note  Patient: Penny Murphy  Procedure(s) Performed: Procedure(s): LUMBAR LAMINECTOMY/DECOMPRESSION BILATERAL MICRODISCECTOMY LUMBAR FOUR-FIVE (Bilateral)  Patient Location: PACU  Anesthesia Type:General  Level of Consciousness: awake, oriented, sedated and patient cooperative  Airway & Oxygen Therapy: Patient Spontanous Breathing and Patient connected to nasal cannula oxygen  Post-op Assessment: Report given to PACU RN and Post -op Vital signs reviewed and stable  Post vital signs: Reviewed and stable  Complications: No apparent anesthesia complications

## 2014-07-19 NOTE — Plan of Care (Signed)
Problem: Consults Goal: Diagnosis - Spinal Surgery Outcome: Completed/Met Date Met:  07/19/14 Lumbar Laminectomy (Complex)

## 2014-07-19 NOTE — Anesthesia Preprocedure Evaluation (Signed)
Anesthesia Evaluation  Patient identified by MRN, date of birth, ID band Patient awake    Reviewed: Allergy & Precautions, H&P , NPO status , Patient's Chart, lab work & pertinent test results  Airway Mallampati: II   Neck ROM: full    Dental   Pulmonary sleep apnea ,          Cardiovascular hypertension, + Peripheral Vascular Disease     Neuro/Psych Anxiety Right sided weakness CVA, Residual Symptoms    GI/Hepatic GERD-  ,  Endo/Other  Morbid obesity  Renal/GU      Musculoskeletal   Abdominal   Peds  Hematology   Anesthesia Other Findings   Reproductive/Obstetrics                             Anesthesia Physical Anesthesia Plan  ASA: III  Anesthesia Plan: General   Post-op Pain Management:    Induction: Intravenous  Airway Management Planned: Oral ETT  Additional Equipment:   Intra-op Plan:   Post-operative Plan: Extubation in OR  Informed Consent: I have reviewed the patients History and Physical, chart, labs and discussed the procedure including the risks, benefits and alternatives for the proposed anesthesia with the patient or authorized representative who has indicated his/her understanding and acceptance.     Plan Discussed with: CRNA, Anesthesiologist and Surgeon  Anesthesia Plan Comments:         Anesthesia Quick Evaluation
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
## 2014-07-19 NOTE — Plan of Care (Signed)
Problem: Consults Goal: Spinal Surgery Patient Education See Patient Education Module for education specifics. Outcome: Completed/Met Date Met:  07/19/14

## 2014-07-19 NOTE — Op Note (Signed)
07/19/2014  12:29 PM  PATIENT:  Penny Murphy  48 y.o. female  PRE-OPERATIVE DIAGNOSIS:  lumbar hernaited disc bilateral L4/5  POST-OPERATIVE DIAGNOSIS:  Lumbar hernaited disc bilateral L4/5  PROCEDURE:  Procedure(s): LUMBAR LAMINECTOMY/DECOMPRESSION left MICRODISCECTOMY LUMBAR FOUR-FIVE  SURGEON:  Ryleigh Buenger L  ASSISTANTS:Jenkins, Tinnie GensJeffrey  ANESTHESIA:   general  EBL:  Total I/O In: 1000 [I.V.:1000] Out: 20 [Blood:20]  BLOOD ADMINISTERED:none  CELL SAVER GIVEN:none  COUNT:per nursing  DRAINS: none   SPECIMEN:  No Specimen  DICTATION: [name] was taken to the operating room, intubated and placed under a general anesthetic without difficulty. She was positioned prone on a Wilson frame with all pressure points padded. Her back was prepped and draped in a sterile manner. I opened the skin with a 10 blade and carried the dissection down to the thoracolumbar fascia. I used both sharp dissection and the monopolar cautery to expose the lamina of 4, and 5 bilaterally. I confirmed my location with an intraoperative xray.  I used the drill, Kerrison punches, and curettes to perform a left semihemilaminectomy of L4. I used the punches to remove the ligamentum flavum to expose the thecal sac. I brought the microscope into the operative field and with Dr.Jenkins' assistance we started our decompression of the spinal canal, thecal sac and 5 root(s). I cauterized epidural veins overlying the disc space then divided them sharply. I opened the disc space with a 15 blade and proceeded with the discectomy. I used pituitary rongeurs, curettes, and other instruments to remove disc material. After the discectomy was completed We inspected the L5 nerve root and felt it was well decompressed. I explored rostrally, laterally, medially, and caudally and was satisfied with the decompression. I irrigated the wound, then closed in layers. I approximated the thoracolumbar fascia, subcutaneous, and  subcuticular planes with vicryl sutures. I used dermabond for a sterile dressing.   PLAN OF CARE: Admit for overnight observation  PATIENT DISPOSITION:  PACU - hemodynamically stable.   Delay start of Pharmacological VTE agent (>24hrs) due to surgical blood loss or risk of bleeding:  yes

## 2014-07-19 NOTE — Evaluation (Signed)
Occupational Therapy Evaluation Patient Details Name: Penny Murphy MRN: [id number] DOB: 08/06/1966 Today's Date: 07/19/2014    History of Present Illness 48 y.o. s/p LUMBAR LAMINECTOMY/DECOMPRESSION BILATERAL MICRODISCECTOMY LUMBAR FOUR-FIVE.   Clinical Impression   Pt s/p above. Feel pt will benefit from acute OT to increase independence with BADLs prior to d/c. Plan to practice with AE next session.    Follow Up Recommendations  No OT follow up;Supervision - Intermittent    Equipment Recommendations  Other (comment);3 in 1 bedside comode    Recommendations for Other Services       Precautions / Restrictions Precautions Precautions: Back Precaution Booklet Issued: Yes (comment) Precaution Comments: Educated on precautions Restrictions Weight Bearing Restrictions: No      Mobility Bed Mobility Overal bed mobility: Needs Assistance Bed Mobility: Rolling;Sidelying to Sit;Sit to Sidelying Rolling: Min assist;Min guard Sidelying to sit: Min assist     Sit to sidelying: Min guard General bed mobility comments: cues for technique. Assist with trunk to sit EOB.  Transfers Overall transfer level: Needs assistance   Transfers: Sit to/from Stand Sit to Stand: Min guard              Balance                                            ADL Overall ADL's : Needs assistance/impaired     Grooming: Wash/dry face;Sitting               Lower Body Dressing: Minimal assistance;Sit to/from stand;With adaptive equipment   Toilet Transfer: Min guard;Ambulation (took a few steps)           Functional mobility during ADLs: Min guard General ADL Comments: Educated on use of cup for teeth care and placement of grooming items to avoid breaking precautions. Educated on AE for LB ADLs as well as what pt could use for toilet aide for hygiene as she states this was difficult prior to surgery. Educated on incorporating precautions in functional  activities. Pt practiced with AE. Became sick in session and vomited. Talked to pt about recommending 3 in 1.     Vision                     Perception     Praxis      Pertinent Vitals/Pain Pain Assessment: 0-10 Pain Score: 7  Pain Location: back Pain Intervention(s): Repositioned;Other (comment);Monitored during session     Hand Dominance Left   Extremity/Trunk Assessment Upper Extremity Assessment Upper Extremity Assessment: RUE deficits/detail RUE Deficits / Details: high tone/weakness in RUE due to previous stroke   Lower Extremity Assessment Lower Extremity Assessment: Defer to PT evaluation       Communication Communication Communication: No difficulties   Cognition Arousal/Alertness: Awake/alert Behavior During Therapy: WFL for tasks assessed/performed Overall Cognitive Status: Within Functional Limits for tasks assessed                     General Comments       Exercises       Shoulder Instructions      Home Living Family/patient expects to be discharged to:: Private residence Living Arrangements: Spouse/significant other Available Help at Discharge: Friend(s);Available PRN/intermittently;Personal care attendant Type of Home: House Home Access: Ramped entrance     Home Layout: One level     Bathroom Shower/Tub: Tub/shower unit  Bathroom Toilet: Standard                Prior Functioning/Environment Level of Independence: Needs assistance    ADL's / Homemaking Assistance Needed: aide comes for 4 hours/day; assists with LB bathing. cleaning, cooking        OT Diagnosis: Acute pain   OT Problem List: Decreased range of motion;Impaired tone;Impaired UE functional use;Pain;Decreased knowledge of precautions;Decreased knowledge of use of DME or AE;Decreased activity tolerance;Decreased strength   OT Treatment/Interventions: Self-care/ADL training;DME and/or AE instruction;Therapeutic activities;Patient/family  education;Balance training    OT Goals(Current goals can be found in the care plan section) Acute Rehab OT Goals Patient Stated Goal: not stated OT Goal Formulation: With patient Time For Goal Achievement: 07/26/14 Potential to Achieve Goals: Good ADL Goals Pt Will Perform Lower Body Bathing: with modified independence;with adaptive equipment;sit to/from stand Pt Will Perform Lower Body Dressing: with modified independence;with adaptive equipment;sit to/from stand Pt Will Transfer to Toilet: with modified independence;ambulating (3 in 1 over commode) Pt Will Perform Toileting - Clothing Manipulation and hygiene: with modified independence;with adaptive equipment;sit to/from stand  OT Frequency: Min 2X/week   Barriers to D/C:            Co-evaluation              End of Session Equipment Utilized During Treatment: Gait belt Nurse Communication: Other (comment) (pt vomited)  Activity Tolerance: Other (comment);Patient limited by pain (nausea) Patient left: in bed;with call bell/phone within reach;with family/visitor present   Time: 5621-30861700-1722 OT Time Calculation (min): 22 min Charges:  OT General Charges $OT Visit: 1 Procedure OT Evaluation $Initial OT Evaluation Tier I: 1 Procedure OT Treatments $Self Care/Home Management : 8-22 mins G-Codes: OT G-codes **NOT FOR INPATIENT CLASS** Functional Assessment Tool Used: clinical judgment Functional Limitation: Self care Self Care Current Status ([id number](G8987): At least 20 percent but less than 40 percent impaired, limited or restricted Self Care Goal Status ([id number](G8988): 0 percent impaired, limited or restricted  Earlie RavelingStraub, Alexsys Eskin L OTR/L [phone] 07/19/2014, 7:09 PM

## 2014-07-19 NOTE — Anesthesia Postprocedure Evaluation (Signed)
Anesthesia Post Note  Patient: Penny CoombsSherrie L Murphy  Procedure(s) Performed: Procedure(s) (LRB): LUMBAR LAMINECTOMY/DECOMPRESSION BILATERAL MICRODISCECTOMY LUMBAR FOUR-FIVE (Bilateral)  Anesthesia type: General  Patient location: PACU  Post pain: Pain level controlled and Adequate analgesia  Post assessment: Post-op Vital signs reviewed, Patient's Cardiovascular Status Stable, Respiratory Function Stable, Patent Airway and Pain level controlled  Last Vitals:  Filed Vitals:   07/19/14 1245  BP: 102/35  Pulse: 55  Temp:   Resp: 12    Post vital signs: Reviewed and stable  Level of consciousness: awake, alert  and oriented  Complications: No apparent anesthesia complications

## 2014-07-19 NOTE — Anesthesia Procedure Notes (Signed)
Procedure Name: Intubation Date/Time: 07/19/2014 10:27 AM Performed by: Coralee RudFLORES, Roma Bierlein Pre-anesthesia Checklist: Patient identified, Emergency Drugs available, Suction available and Patient being monitored Patient Re-evaluated:Patient Re-evaluated prior to inductionOxygen Delivery Method: Circle system utilized Preoxygenation: Pre-oxygenation with 100% oxygen Intubation Type: IV induction Ventilation: Mask ventilation without difficulty Laryngoscope Size: Miller and 3 Grade View: Grade II Tube type: Oral Tube size: 8.0 mm Number of attempts: 1 Airway Equipment and Method: Stylet

## 2014-07-20 ENCOUNTER — Encounter (HOSPITAL_COMMUNITY): Payer: Self-pay | Admitting: Neurosurgery

## 2014-07-20 DIAGNOSIS — M5126 Other intervertebral disc displacement, lumbar region: Secondary | ICD-10-CM | POA: Diagnosis not present

## 2014-07-20 MED ORDER — CYCLOBENZAPRINE HCL 10 MG PO TABS: 10.0000 mg | ORAL_TABLET | Freq: Three times a day (TID) | ORAL | Status: AC | PRN

## 2014-07-20 MED ORDER — HYDROCODONE-ACETAMINOPHEN 5-325 MG PO TABS: 1.0000 | ORAL_TABLET | Freq: Four times a day (QID) | ORAL | Status: AC | PRN

## 2014-07-20 NOTE — Evaluation (Signed)
Physical Therapy Evaluation Patient Details Name: Penny CoombsSherrie L Franta MRN: [id number] DOB: 01/09/1966 Today's Date: 07/20/2014   History of Present Illness  48 y.o. s/p LUMBAR LAMINECTOMY/DECOMPRESSION BILATERAL MICRODISCECTOMY LUMBAR FOUR-FIVE.  Clinical Impression  Pt currently with functional limitations due to decreased strength, balance, mobility and increased pain. Pt will benefit from skilled PT to increase independence and safety with mobility to allow discharge to home with assistance. Pt able to safely ambulate using rolling walker. Pt has sufficient flexor tone in hand to hold walker with Right hand. Pt would benefit from continued PT services after discharge but her insurance will not allow for this. Pt's Case Manager has arranged for a home health nurse to come assess Pt's bathroom for safe usage by Pt.      Follow Up Recommendations No PT follow up    Equipment Recommendations  Rolling walker with 5" wheels    Recommendations for Other Services       Precautions / Restrictions Precautions Precautions: Back Precaution Comments: Educated on precautions Restrictions Weight Bearing Restrictions: No      Mobility  Bed Mobility Overal bed mobility: Needs Assistance Bed Mobility: Rolling;Sidelying to Sit;Sit to Sidelying Rolling: Min guard Sidelying to sit: Supervision     Sit to sidelying: Supervision General bed mobility comments: Pt required increased time to perform bed mobility but did not require any physical assistance from PT. PT gave some verbal cuing to preserve back precautions.  Transfers Overall transfer level: Needs assistance Equipment used: Rolling walker (2 wheeled) Transfers: Sit to/from Stand Sit to Stand: Min guard         General transfer comment: Pt able to stand from lowered bed with min guard assist from PT for safety. PT stabilized Right hand on walker initially while moving to stand.   Ambulation/Gait Ambulation/Gait assistance: Min  guard Ambulation Distance (Feet): 40 Feet Assistive device: Rolling walker (2 wheeled) Gait Pattern/deviations: Decreased stride length;Step-through pattern   Gait velocity interpretation: Below normal speed for age/gender General Gait Details: Pt able to ambulate out into to hall to nurse station and back into room without increase in back pain. Pt states she only ambulates short distances at home. Pt has sufficient flexor tone in Right hand that she is able to position her hand arm the grip of a RW. Pt felt safer ambulating with bilateral UE support than with a cane.   Stairs            Wheelchair Mobility    Modified Rankin (Stroke Patients Only)       Balance Overall balance assessment: Needs assistance Sitting-balance support: Feet supported;No upper extremity supported Sitting balance-Leahy Scale: Fair     Standing balance support: During functional activity;Bilateral upper extremity supported Standing balance-Leahy Scale: Poor                               Pertinent Vitals/Pain Pain Assessment: 0-10 Pain Score: 7  Faces Pain Scale: Hurts little more Pain Location: Back Pain Descriptors / Indicators: Grimacing;Guarding Pain Intervention(s): Limited activity within patient's tolerance;Monitored during session;Repositioned    Home Living Family/patient expects to be discharged to:: Private residence Living Arrangements: Spouse/significant other Available Help at Discharge: Friend(s);Available PRN/intermittently;Personal care attendant Type of Home: House Home Access: Ramped entrance     Home Layout: One level        Prior Function Level of Independence: Needs assistance      ADL's / Homemaking Assistance Needed: Aide assist pt  with activities such as dressing and bathing        Hand Dominance   Dominant Hand: Left    Extremity/Trunk Assessment               Lower Extremity Assessment: Overall WFL for tasks assessed;RLE  deficits/detail RLE Deficits / Details: Pt has decreased strength and ROM; R deficits > Left       Communication   Communication: No difficulties  Cognition Arousal/Alertness: Awake/alert Behavior During Therapy: WFL for tasks assessed/performed Overall Cognitive Status: Within Functional Limits for tasks assessed                      General Comments      Exercises General Exercises - Lower Extremity Ankle Circles/Pumps: AROM;10 reps;Seated;Both Long Arc Quad: AROM;Both;10 reps;Seated      Assessment/Plan    PT Assessment Patient needs continued PT services  PT Diagnosis Difficulty walking   PT Problem List Decreased strength;Decreased activity tolerance;Decreased mobility;Decreased balance;Decreased knowledge of use of DME;Pain;Decreased knowledge of precautions  PT Treatment Interventions DME instruction;Gait training;Functional mobility training;Therapeutic activities;Therapeutic exercise;Balance training;Patient/family education   PT Goals (Current goals can be found in the Care Plan section) Acute Rehab PT Goals Patient Stated Goal: not stated PT Goal Formulation: With patient Time For Goal Achievement: 08/03/14 Potential to Achieve Goals: Fair    Frequency Min 3X/week   Barriers to discharge        Co-evaluation               End of Session Equipment Utilized During Treatment: Gait belt Activity Tolerance: Patient tolerated treatment well;Patient limited by fatigue;Patient limited by pain Patient left: in bed;with call bell/phone within reach      Functional Assessment Tool Used: clinical judgment Functional Limitation: Mobility: Walking and moving around Mobility: Walking and Moving Around Current Status ([id number](G8978): At least 20 percent but less than 40 percent impaired, limited or restricted Mobility: Walking and Moving Around Goal Status [phone](G8979): At least 1 percent but less than 20 percent impaired, limited or restricted    Time:  1205-1227 PT Time Calculation (min) (ACUTE ONLY): 22 min   Charges:   PT Evaluation $Initial PT Evaluation Tier I: 1 Procedure PT Treatments $Gait Training: 8-22 mins   PT G Codes:   Functional Assessment Tool Used: clinical judgment Functional Limitation: Mobility: Walking and moving around    [place] LebanonHebert, MarburyOlivia, SPT 07/20/2014, 2:13 PM   York SpanielOlivia Hebert, SPT  Acute Rehabilitation [phone]

## 2014-07-20 NOTE — Discharge Summary (Signed)
  Physician Discharge Summary  Patient ID: Penny CoombsSherrie L Ketcham MRN: [id number] DOB/AGE: 50/10/1965 50 y.o.  Admit date: 07/19/2014 Discharge date: 07/20/2014  Admission Diagnoses:Lumbar herniated disc, L4/5  Discharge Diagnoses:  Active Problems:   HNP (herniated nucleus pulposus), lumbar   Discharged Condition: good  Hospital Course: Ms. Clelia CroftShaw was taken to the operating room for an uncomplicated lumbar laminectomy from the left to remove a large midline disc herniation. She has done very well after the procedure with less pain in the lower extremities. She is ambulating, tolerating a regular diet, and voiding without difficulty.    Treatments: surgery: Left L4/5 semihemilaminectomy and discetomy.  Discharge Exam: Blood pressure 108/49, pulse 80, temperature 98.4 F (36.9 C), temperature source Oral, resp. rate 18, weight 112.492 kg (248 lb), SpO2 94 %. General appearance: alert, cooperative, appears stated age and no distress  Moving lower extremities well.   Disposition:  lumbar hernaited disc    Medication List    TAKE these medications        ALPRAZolam 1 MG tablet  Commonly known as:  XANAX  Take 1 mg by mouth at bedtime as needed for sleep.     cyclobenzaprine 10 MG tablet  Commonly known as:  FLEXERIL  Take 10 mg by mouth 2 (two) times daily.     cyclobenzaprine 10 MG tablet  Commonly known as:  FLEXERIL  Take 1 tablet (10 mg total) by mouth 3 (three) times daily as needed for muscle spasms.     HYDROcodone-acetaminophen 10-325 MG per tablet  Commonly known as:  NORCO  Take 1 tablet by mouth every 6 (six) hours as needed for pain.     HYDROcodone-acetaminophen 5-325 MG per tablet  Commonly known as:  NORCO/VICODIN  Take 1 tablet by mouth every 6 (six) hours as needed for moderate pain.     metoprolol 50 MG tablet  Commonly known as:  LOPRESSOR  Take 50 mg by mouth 2 (two) times daily.     naproxen sodium 220 MG tablet  Commonly known as:  ANAPROX  Take 220  mg by mouth 2 (two) times daily with a meal.     Oxycodone HCl 10 MG Tabs  Take 10 mg by mouth 3 (three) times daily.           Follow-up Information    Follow up with Imri Lor L, MD In 3 weeks.   Specialty:  Neurosurgery   Why:  call office to make an appointment   Contact information:   [address] [place] [address] [phone]       Signed: Anjel Pardo L 07/20/2014, 11:43 AM

## 2014-07-20 NOTE — Progress Notes (Signed)
Pt and daughter given D/C instructions with Rx's, verbal understanding was provided. Pt's incision is clean and dry with no sign of infection. Pt's IV was removed prior to D/C. Pt D/C'd home via wheelchair @ 1515 per MD order. Pt received RW and 3-n-1 prior to D/C. Pt is stable @ D/C and has no other needs at this time. Rema FendtAshley Youlanda Tomassetti, RN

## 2014-07-20 NOTE — Care Management Note (Signed)
CARE MANAGEMENT NOTE 07/20/2014  Patient:  HARTMAN,POLLYANN Y   Account Number:  [id number]  Date Initiated:  07/20/2014  Documentation initiated by:  Aivy Akter  Subjective/Objective Assessment:   48 yr old female admitted with Osteoarthritis of her right knee. Patient had a right total knee arthroplasty.     Action/Plan:   Patient was preoperatively setup with [facility], no changes. Patient has family support at discharge.Patient has rolling walker, 3in1.CPM   Anticipated DC Date:  07/20/2014   Anticipated DC Plan:  HOME W HOME HEALTH SERVICES      DC Planning Services  CM consult      PAC Choice  HOME HEALTH  DURABLE MEDICAL EQUIPMENT  HOME HEALTH   Choice offered to / List presented to:  C-1 Patient   DME arranged  CPM      DME agency  [employer]     HH arranged  HH-2 PT      HH agency  [facility]   Status of service:  Completed, signed off Medicare Important Message given?  NA - LOS <3 / Initial given by admissions (If response is "NO", the following Medicare IM given date fields will be blank) Date Medicare IM given:   Medicare IM given by:   Date Additional Medicare IM given:   Additional Medicare IM given by:    Discharge Disposition:  HOME W HOME HEALTH SERVICES  Per UR Regulation:  Reviewed for med. necessity/level of care/duration of stay  If discussed at Long Length of Stay Meetings, dates discussed:    

## 2014-07-20 NOTE — Discharge Instructions (Signed)
Lumbar Discectomy °Care After °A discectomy involves removal of discmaterial (the cartilage-like structures located between the bones of the back). It is done to relieve pressure on nerve roots. It can be used as a treatment for a back problem. The time in surgery depends on the findings in surgery and what is necessary to correct the problems. °HOME CARE INSTRUCTIONS  °· Check the cut (incision) made by the surgeon twice a day for signs of infection. Some signs of infection may include:  °· A foul smelling, greenish or yellowish discharge from the wound.  °· Increased pain.  °· Increased redness over the incision (operative) site.  °· The skin edges may separate.  °· Flu-like symptoms (problems).  °· A temperature above 101.5° F (38.6° C).  °· Change your bandages in about 24 to 36 hours following surgery or as directed.  °· You may shower tomrrow.  Avoid bathtubs, swimming pools and hot tubs for three weeks or until your incision has healed completely. °· Follow your doctor's instructions as to safe activities, exercises, and physical therapy.  °· Weight reduction may be beneficial if you are overweight.  °· Daily exercise is helpful to prevent the return of problems. Walking is permitted. You may use a treadmill without an incline. Cut down on activities and exercise if you have discomfort. You may also go up and down stairs as much as you can tolerate.  °· DO NOT lift anything heavier than 10 to 15 lbs. Avoid bending or twisting at the waist. Always bend your knees when lifting.  °· Maintain strength and range of motion as instructed.  °· Do not drive for 10 days, or as directed by your doctors. You may be a passenger . Lying back in the passenger seat may be more comfortable for you. Always wear a seatbelt.  °· Limit your sitting in a regular chair to 20 to 30 minutes at a time. There are no limitations for sitting in a recliner. You should lie down or walk in between sitting periods.  °· Only take  over-the-counter or prescription medicines for pain, discomfort, or fever as directed by your caregiver.  °SEEK MEDICAL CARE IF:  °· There is increased bleeding (more than a small spot) from the wound.  °· You notice redness, swelling, or increasing pain in the wound.  °· Pus is coming from wound.  °· You develop an unexplained oral temperature above 102° F (38.9° C) develops.  °· You notice a foul smell coming from the wound or dressing.  °· You have increasing pain in your wound.  °SEEK IMMEDIATE MEDICAL CARE IF:  °· You develop a rash.  °· You have difficulty breathing.  °· You develop any allergic problems to medicines given.  °Document Released: 07/30/2004 Document Revised: 08/14/2011 Document Reviewed: 11/18/2007 °[employer]Care® Patient Information °

## 2014-07-20 NOTE — Progress Notes (Signed)
Occupational Therapy Treatment Patient Details Name: Penny CoombsSherrie L Murphy MRN: [id number] DOB: 07/26/1966 Today's Date: 07/20/2014    History of present illness 48 y.o. s/p LUMBAR LAMINECTOMY/DECOMPRESSION BILATERAL MICRODISCECTOMY LUMBAR FOUR-FIVE.   OT comments  Focus of session on use of AE for LB ADL and toileting.  Instructed pt in back precautions related to IADL. Educated pt in multiple uses of 3 in 1.  Follow Up Recommendations  No OT follow up;Supervision - Intermittent    Equipment Recommendations  3 in 1 bedside comode    Recommendations for Other Services      Precautions / Restrictions Precautions Precautions: Back Precaution Comments: Educated on precautions related to ADL and IADL. Restrictions Weight Bearing Restrictions: No       Mobility Bed Mobility Overal bed mobility: Needs Assistance Bed Mobility: Rolling;Sidelying to Sit;Sit to Sidelying   Sidelying to sit: Supervision     Sit to sidelying: Supervision General bed mobility comments: cues for technique, extra time, no physical assist  Transfers       Sit to Stand: Supervision              Balance                                   ADL Overall ADL's : Needs assistance/impaired                                       General ADL Comments: Instructed and provided reacher, long handled sponge, sock aide, toilet aide and long shoe horn.      Vision                     Perception     Praxis      Cognition   Behavior During Therapy: WFL for tasks assessed/performed Overall Cognitive Status: Within Functional Limits for tasks assessed                       Extremity/Trunk Assessment               Exercises     Shoulder Instructions       General Comments      Pertinent Vitals/ Pain       Pain Assessment: Faces Faces Pain Scale: Hurts little more Pain Location: back Pain Descriptors / Indicators: Grimacing;Guarding Pain  Intervention(s): Monitored during session;Premedicated before session;Repositioned  Home Living Family/patient expects to be discharged to:: Private residence Living Arrangements: Spouse/significant other Available Help at Discharge: Friend(s);Available PRN/intermittently;Personal care attendant                                    Prior Functioning/Environment              Frequency Min 2X/week     Progress Toward Goals  OT Goals(current goals can now be found in the care plan section)  Progress towards OT goals: Progressing toward goals     Plan Discharge plan remains appropriate    Co-evaluation                 End of Session     Activity Tolerance Patient tolerated treatment well   Patient Left in bed;with call bell/phone within reach;with nursing/sitter in room  Nurse Communication  (needs 3 in 1)        Time: 1610-96041153-1207 OT Time Calculation (min): 14 min  Charges: OT General Charges $OT Visit: 1 Procedure OT Treatments $Self Care/Home Management : 8-22 mins  Evern BioMayberry, Twain Stenseth Lynn 07/20/2014, 12:11 PM  [phone]

## 2016-07-30 ENCOUNTER — Ambulatory Visit (INDEPENDENT_AMBULATORY_CARE_PROVIDER_SITE_OTHER): Payer: Medicaid Other | Admitting: Orthopedic Surgery

## 2016-07-30 ENCOUNTER — Encounter (INDEPENDENT_AMBULATORY_CARE_PROVIDER_SITE_OTHER): Payer: Self-pay | Admitting: Orthopedic Surgery

## 2016-07-30 DIAGNOSIS — M5442 Lumbago with sciatica, left side: Secondary | ICD-10-CM

## 2016-07-30 MED ORDER — OXYCODONE-ACETAMINOPHEN 5-325 MG PO TABS: 1.0000 | ORAL_TABLET | Freq: Every day | ORAL | 0 refills | Status: AC

## 2016-07-30 MED ORDER — CYCLOBENZAPRINE HCL 10 MG PO TABS
10.0000 mg | ORAL_TABLET | Freq: Three times a day (TID) | ORAL | 0 refills | Status: AC | PRN
Start: 2016-07-30 — End: ?

## 2016-07-30 NOTE — Progress Notes (Signed)
Office Visit Note   Patient: Penny Murphy           Date of Birth: 05/16/1966           MRN: [id number] Visit Date: 07/30/2016 Requested by: No referring provider defined for this encounter. PCP: Pcp Not In System  Subjective: Chief Complaint  Patient presents with  . Lower Back - Pain    HPI Penny Murphy is a 50 year old patient with low back pain.  I last saw her June 2016 at that time I recommended that she follow up with Dr. Mikal Planeabell who is her operating surgeon.  She had surgery in November 2015.  She did not do that he has been on a pain management type program with Dr. Mayford KnifeWilliams and aspirin.  He is now no longer taking Medicaid patients.  She describes pain running down the left leg.  I discussed with her at length the necessity is seeing Dr. Mikal Planeabell for this problem.  Also discussed with her about getting an MRI scan to try to work this up.  This is also discussed a year and a half ago.  The patient's primary total by her own admission is to continue pain management which we do not do here.  She does have an appointment with Dr. Fabiola Backerabel in 2018 but it is not clear if she is actually but that appointment ORIF that's just his availability.  I think her main focus and goal is for pain management.  He think she's having some symptoms.  Has before urine a half ago I will write her one time prescription for 20 Percocet 30 Flexeril with no refills.  My recommendation is for MRI scanning and follow up with Dr. Barnie Delabelll.  She wishes to proceed with trying to find another primary care physician for pain management.              Review of Systems All systems reviewed are negative as they relate to the chief complaint within the history of present illness.  Patient denies  fevers or chills.    Assessment & Plan: Visit Diagnoses:  1. Acute back pain with sciatica, left     Plan: Plan is one-time prescription for Percocet Flexeril.  My recommendation is for MRI scan and follow-up with Dr. Mikal Planeabell.  She is  going to work on finding another pain [employer].  I'll see her back as needed.  Please see history of present illness for fuller discussion  Follow-Up Instructions: Return if symptoms worsen or fail to improve.   Orders:  No orders of the defined types were placed in this encounter.  Meds ordered this encounter  Medications  . oxyCODONE-acetaminophen (ROXICET) 5-325 MG tablet    Sig: Take 1-2 tablets by mouth daily.    Dispense:  20 tablet    Refill:  0  . cyclobenzaprine (FLEXERIL) 10 MG tablet    Sig: Take 1 tablet (10 mg total) by mouth 3 (three) times daily as needed for muscle spasms.    Dispense:  30 tablet    Refill:  0      Procedures: No procedures performed   Clinical Data: No additional findings.  Objective: Vital Signs: There were no vitals taken for this visit.  Physical Exam  Constitutional: She appears well-developed.  HENT:  Head: Normocephalic.  Eyes: EOM are normal.  Neck: Normal range of motion.  Cardiovascular: Normal rate.   Pulmonary/Chest: Effort normal.  Neurological: She is alert.  Skin: Skin is warm.  Psychiatric: She has  a normal mood and affect.    Ortho Exam on exam the patient has mildly positive nerve retention signs on the left negative on the right good ankle dorsi and plantarflexion quad and hamstring strength.  No groin pain with internal/external rotation of the leg.  Perfused feet.  Mild paresthesias present in the L5 distribution left versus right.  Specialty Comments:  No specialty comments available.  Imaging: No results found.   PMFS History: Patient Active Problem List   Diagnosis Date Noted  . HNP (herniated nucleus pulposus), lumbar 07/19/2014  . Pre-operative cardiovascular examination 06/28/2014  . 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 Obesity (BMI 39) 06/28/2014  . Sleep apnea 06/28/2014  . HTN (hypertension) 06/28/2014  . Abnormal EKG 06/28/2014  . Thoracic or lumbosacral  neuritis or radiculitis, unspecified 06/21/2012  . Lumbar disc disease 06/21/2012  . Cerebral thrombosis with cerebral infarction (left) 06/21/2012  . Spastic hemiplegia affecting dominant side (HCC) 06/21/2012   Past Medical History:  Diagnosis Date  . Anxiety   . GERD (gastroesophageal reflux disease)   . Hyperlipidemia   . Hypertension   . Insomnia   . Right sided weakness   . Sleep apnea    does not use CPAP  . Stroke [facility]) 04/20/2001   right sided weakness    Family History  Problem Relation Age of Onset  . Heart disease Mother   . Diabetes Mother   . Heart disease Father     Past Surgical History:  Procedure Laterality Date  . ABDOMINAL HYSTERECTOMY    . CESAREAN SECTION    . LUMBAR LAMINECTOMY/DECOMPRESSION MICRODISCECTOMY Bilateral 07/19/2014   Procedure: LUMBAR LAMINECTOMY/DECOMPRESSION BILATERAL MICRODISCECTOMY LUMBAR FOUR-FIVE;  Surgeon: Coletta MemosKyle Cabbell, MD;  Location: [facility];  Service: Neurosurgery;  Laterality: Bilateral;   Social History   Occupational History  . Not on file.   Social History Main Topics  . Smoking status: Never Smoker  . Smokeless tobacco: Never Used  . Alcohol use No  . Drug use: No  . Sexual activity: Not on file

## 2020-01-07 DEATH — deceased
